# Patient Record
Sex: Male | Born: 1960 | Race: White | Hispanic: No | Marital: Married | Smoking: Never smoker
Health system: Southern US, Community
[De-identification: ages and names within clinical notes are randomized; demographics above are authoritative.]

## PROBLEM LIST (undated history)

## (undated) DIAGNOSIS — T7840XA Allergy, unspecified, initial encounter: Secondary | ICD-10-CM

## (undated) DIAGNOSIS — Z87442 Personal history of urinary calculi: Secondary | ICD-10-CM

## (undated) DIAGNOSIS — L0390 Cellulitis, unspecified: Secondary | ICD-10-CM

## (undated) DIAGNOSIS — K219 Gastro-esophageal reflux disease without esophagitis: Secondary | ICD-10-CM

## (undated) DIAGNOSIS — C4491 Basal cell carcinoma of skin, unspecified: Secondary | ICD-10-CM

## (undated) DIAGNOSIS — Z8619 Personal history of other infectious and parasitic diseases: Secondary | ICD-10-CM

## (undated) HISTORY — DX: Personal history of urinary calculi: Z87.442

## (undated) HISTORY — DX: Allergy, unspecified, initial encounter: T78.40XA

## (undated) HISTORY — DX: Basal cell carcinoma of skin, unspecified: C44.91

## (undated) HISTORY — DX: Gastro-esophageal reflux disease without esophagitis: K21.9

## (undated) HISTORY — DX: Personal history of other infectious and parasitic diseases: Z86.19

---

## 2005-08-15 HISTORY — PX: SHOULDER ARTHROSCOPY W/ ROTATOR CUFF REPAIR: SHX2400

## 2008-10-23 ENCOUNTER — Emergency Department (HOSPITAL_COMMUNITY): Admission: EM | Admit: 2008-10-23 | Discharge: 2008-10-23 | Payer: Self-pay | Admitting: Emergency Medicine

## 2010-08-15 HISTORY — PX: COLONOSCOPY: SHX174

## 2010-11-25 LAB — URINALYSIS, ROUTINE W REFLEX MICROSCOPIC
Bilirubin Urine: NEGATIVE
Glucose, UA: NEGATIVE mg/dL
Specific Gravity, Urine: 1.019 (ref 1.005–1.030)
pH: 7 (ref 5.0–8.0)

## 2010-11-25 LAB — COMPREHENSIVE METABOLIC PANEL
ALT: 17 U/L (ref 0–53)
AST: 25 U/L (ref 0–37)
Albumin: 3.9 g/dL (ref 3.5–5.2)
Chloride: 104 mEq/L (ref 96–112)
Creatinine, Ser: 1.5 mg/dL (ref 0.4–1.5)
GFR calc Af Amer: >60 mL/min (ref >60)
Sodium: 139 mEq/L (ref 135–145)
Total Bilirubin: 0.9 mg/dL (ref 0.3–1.2)

## 2010-11-25 LAB — DIFFERENTIAL
Basophils Absolute: 0 10*3/uL (ref 0.0–0.1)
Eosinophils Relative: 0 % (ref 0–5)
Lymphocytes Relative: 5 % — ABNORMAL LOW (ref 12–46)
Lymphs Abs: 0.5 10*3/uL — ABNORMAL LOW (ref 0.7–4.0)
Monocytes Absolute: 0.4 10*3/uL (ref 0.1–1.0)

## 2010-11-25 LAB — CBC
MCV: 84.4 fL (ref 78.0–100.0)
Platelets: 207 10*3/uL (ref 150–400)
WBC: 10.6 10*3/uL — ABNORMAL HIGH (ref 4.0–10.5)

## 2010-11-25 LAB — URINE MICROSCOPIC-ADD ON

## 2013-12-13 DIAGNOSIS — L0390 Cellulitis, unspecified: Secondary | ICD-10-CM

## 2013-12-13 HISTORY — DX: Cellulitis, unspecified: L03.90

## 2014-01-07 ENCOUNTER — Encounter (HOSPITAL_COMMUNITY): Payer: Self-pay | Admitting: Emergency Medicine

## 2014-01-07 ENCOUNTER — Inpatient Hospital Stay (HOSPITAL_COMMUNITY)
Admission: EM | Admit: 2014-01-07 | Discharge: 2014-01-09 | DRG: 603 | Disposition: A | Discharge status: Home / Self Care | Payer: BC Managed Care – PPO | Attending: Internal Medicine | Admitting: Internal Medicine

## 2014-01-07 DIAGNOSIS — L03114 Cellulitis of left upper limb: Secondary | ICD-10-CM | POA: Diagnosis present

## 2014-01-07 DIAGNOSIS — S51852A Open bite of left forearm, initial encounter: Secondary | ICD-10-CM

## 2014-01-07 DIAGNOSIS — Z79899 Other long term (current) drug therapy: Secondary | ICD-10-CM

## 2014-01-07 DIAGNOSIS — L0390 Cellulitis, unspecified: Secondary | ICD-10-CM | POA: Diagnosis present

## 2014-01-07 DIAGNOSIS — L089 Local infection of the skin and subcutaneous tissue, unspecified: Secondary | ICD-10-CM | POA: Diagnosis present

## 2014-01-07 DIAGNOSIS — S51859A Open bite of unspecified forearm, initial encounter: Secondary | ICD-10-CM

## 2014-01-07 DIAGNOSIS — IMO0001 Reserved for inherently not codable concepts without codable children: Secondary | ICD-10-CM | POA: Diagnosis present

## 2014-01-07 DIAGNOSIS — Z87442 Personal history of urinary calculi: Secondary | ICD-10-CM

## 2014-01-07 DIAGNOSIS — IMO0002 Reserved for concepts with insufficient information to code with codable children: Principal | ICD-10-CM | POA: Diagnosis present

## 2014-01-07 DIAGNOSIS — Z23 Encounter for immunization: Secondary | ICD-10-CM

## 2014-01-07 DIAGNOSIS — W5501XA Bitten by cat, initial encounter: Secondary | ICD-10-CM

## 2014-01-07 DIAGNOSIS — S51809A Unspecified open wound of unspecified forearm, initial encounter: Secondary | ICD-10-CM | POA: Diagnosis present

## 2014-01-07 HISTORY — DX: Cellulitis, unspecified: L03.90

## 2014-01-07 LAB — CBC
HCT: 41.2 % (ref 39.0–52.0)
Hemoglobin: 14.1 g/dL (ref 13.0–17.0)
MCH: 29.9 pg (ref 26.0–34.0)
MCHC: 34.2 g/dL (ref 30.0–36.0)
MCV: 87.3 fL (ref 78.0–100.0)
PLATELETS: 195 10*3/uL (ref 150–400)
RBC: 4.72 MIL/uL (ref 4.22–5.81)
RDW: 13.2 % (ref 11.5–15.5)
WBC: 10 10*3/uL (ref 4.0–10.5)

## 2014-01-07 LAB — BASIC METABOLIC PANEL
BUN: 19 mg/dL (ref 6–23)
CHLORIDE: 101 meq/L (ref 96–112)
CO2: 28 meq/L (ref 19–32)
CREATININE: 1.3 mg/dL (ref 0.50–1.35)
Calcium: 9 mg/dL (ref 8.4–10.5)
GFR calc non Af Amer: 63 mL/min — ABNORMAL LOW (ref >90)
GFR, EST AFRICAN AMERICAN: 73 mL/min — AB (ref >90)
Glucose, Bld: 97 mg/dL (ref 70–99)
POTASSIUM: 4.2 meq/L (ref 3.7–5.3)
SODIUM: 140 meq/L (ref 137–147)

## 2014-01-07 MED ORDER — SODIUM CHLORIDE 0.9 % IJ SOLN
3.0000 mL | Freq: Two times a day (BID) | INTRAMUSCULAR | Status: DC
  Administered 2014-01-08 (×3): 3 mL via INTRAVENOUS

## 2014-01-07 MED ORDER — MORPHINE SULFATE 4 MG/ML IJ SOLN: 4.0000 mg | Freq: Once | INTRAMUSCULAR | Status: DC

## 2014-01-07 MED ORDER — SODIUM CHLORIDE 0.9 % IV SOLN
Freq: Once | INTRAVENOUS | Status: AC
  Administered 2014-01-07: 21:00:00 via INTRAVENOUS

## 2014-01-07 MED ORDER — ONDANSETRON HCL 4 MG/2ML IJ SOLN
4.0000 mg | Freq: Once | INTRAMUSCULAR | Status: DC
Start: 2014-01-07 — End: 2014-01-07

## 2014-01-07 MED ORDER — ALBUTEROL SULFATE (2.5 MG/3ML) 0.083% IN NEBU: 3.0000 mL | INHALATION_SOLUTION | Freq: Four times a day (QID) | RESPIRATORY_TRACT | Status: DC | PRN

## 2014-01-07 MED ORDER — SODIUM CHLORIDE 0.9 % IJ SOLN: 3.0000 mL | INTRAMUSCULAR | Status: DC | PRN

## 2014-01-07 MED ORDER — SODIUM CHLORIDE 0.9 % IV SOLN: INTRAVENOUS | Status: DC

## 2014-01-07 MED ORDER — SODIUM CHLORIDE 0.9 % IV SOLN
3.0000 g | Freq: Once | INTRAVENOUS | Status: AC
  Administered 2014-01-07: 3 g via INTRAVENOUS
  Filled 2014-01-07: qty 3

## 2014-01-07 MED ORDER — AMPICILLIN-SULBACTAM SODIUM 3 (2-1) G IJ SOLR
3.0000 g | Freq: Four times a day (QID) | INTRAMUSCULAR | Status: DC
  Administered 2014-01-08 – 2014-01-09 (×6): 3 g via INTRAVENOUS
  Filled 2014-01-07 (×9): qty 3

## 2014-01-07 MED ORDER — TETANUS-DIPHTH-ACELL PERTUSSIS 5-2.5-18.5 LF-MCG/0.5 IM SUSP
0.5000 mL | Freq: Once | INTRAMUSCULAR | Status: AC
  Administered 2014-01-07: 0.5 mL via INTRAMUSCULAR
  Filled 2014-01-07: qty 0.5

## 2014-01-07 MED ORDER — SODIUM CHLORIDE 0.9 % IV SOLN: 250.0000 mL | INTRAVENOUS | Status: DC | PRN

## 2014-01-07 NOTE — ED Notes (Signed)
Clarified for patient, Dr. Grandville Silos was consulted for hand surgeon.

## 2014-01-07 NOTE — ED Notes (Signed)
Remind patient that he still has pain medication available if needed. No meds requested at this time.

## 2014-01-07 NOTE — Consult Note (Signed)
I spoke with NP Carolanne Grumbling via telephone, seeking advice regarding this patient whom I understand to have had stray cat bites/scratches on the upper extremity, with increasing pain and redness despite beginning on Augmentin.  It was conveyed that the patient had cellulitis, without an abscess, but having progressed on oral antibiotics, it would likely be appropriate for admission for IV antibiotics and observation for response.  I concurred with this assessement, and we agreed that there was no present indication for surgical involvement.  I recommended that the admitting service re-consult surgery if the patient's condition changes such that surgical involvement would be appropriate. (develops a surgically addressable lesion)  Rayvon Char. Grandville Silos, [place] [place], [place]  [address] Office: [phone] Mobile: [phone]

## 2014-01-07 NOTE — ED Notes (Signed)
Patient was bitten by a stray cat that the picked up on the side of the road.  Patient's dermatologist prescribed Augmentin for him but his arm is getting worse.  He has swelling,redness and it is hot to the touch.  He rates his pain 5/10.  The incident happened at 0630hrs and by 1500 he could not move his arm it was so painful.

## 2014-01-07 NOTE — Progress Notes (Signed)
ANTIBIOTIC CONSULT NOTE - INITIAL  Pharmacy Consult for unasyn Indication: animal bite wound infection  No Known Allergies  Patient Measurements: Height: 5' 9.5" (176.5 cm) Weight: 185 lb (83.915 kg) IBW/kg (Calculated) : 71.85   Vital Signs: Temp: 98.2 F (36.8 C) (05/26 1953) Temp src: Oral (05/26 1953) BP: 118/76 mmHg (05/26 2130) Pulse Rate: 64 (05/26 2130) Intake/Output from previous day:   Intake/Output from this shift:    Labs:  Recent Labs  01/07/14 1823 01/07/14 1924  WBC 10.0  --   HGB 14.1  --   PLT 195  --   CREATININE  --  1.30   Estimated Creatinine Clearance: 69.1 ml/min (by C-G formula based on Cr of 1.3). No results found for this basename: VANCOTROUGH, VANCOPEAK, VANCORANDOM, GENTTROUGH, GENTPEAK, GENTRANDOM, TOBRATROUGH, TOBRAPEAK, TOBRARND, AMIKACINPEAK, AMIKACINTROU, AMIKACIN,  in the last 72 hours   Microbiology: No results found for this or any previous visit (from the past 720 hour(s)).  Medical History: History reviewed. No pertinent past medical history.  Medications:  See med rec  Assessment: Patient is a 53 y.o. M who was bitten in the hand by a stray cat.  He was on augmentin PTA (prescription was for 7 days and he had 2 days of it).  He now presents to the ED with c/o increase swelling and redness at the wound site. Patient received unasyn 3gm dose in the ED at 2031.  Plan:  1) unasyn 3gm IV q6h  Kasmira Cacioppo P Cherice Glennie 01/07/2014,10:03 PM

## 2014-01-07 NOTE — H&P (Signed)
Chief Complaint:  Left Arm pain swelling and redness  HPI: 53 yo male healthy was scratched by a cat over the weekend and Sunday started swelling, getting red to left forearm.  Sunday afternoon, his father in law who is a dermatologist (pt does not have a dermatological disease) gave him a script for augmentin which he started Sunday evening.  At that time the left forearm was painful to move, not actually the hand or elbow, he was scratched/bitten in several places but the area that has been most painful is the bite at the mid forearm area.  He has taken the augmentin tid and the swelling and redness has gotten a little worse.  The pain has improved but the erythema has extended from the mid forearm down to the hand and up past the elbow.  He reports his wife is a pediatrician, and earlier today there was some purulent discharge from the site of the mid forearm that he expressed, but none since.  His wife finally convinced him to come to the ED today, as this was not getting any better after 48 hours of augmentin.  He denies any n/v/d.  No fevers/chills.  Again no pain with movement of the wrists, fingers or elbow of the left upper extremity.  Has not been on any other abx besides the augmentin recently.  No health problems.  Was just given a tetanus shot in the ED.   Review of Systems:  Positive and negative as per HPI otherwise all other systems are negative  Past Medical History: History reviewed. No pertinent past medical history. Past Surgical History  Procedure Laterality Date  . Shoulder arthroscopy      Medications: Prior to Admission medications   Medication Sig Start Date End Date Taking? Authorizing Provider  albuterol (PROVENTIL HFA;VENTOLIN HFA) 108 (90 BASE) MCG/ACT inhaler Inhale 2 puffs into the lungs every 6 (six) hours as needed (Allergic asthma).   Yes Historical Provider, MD  amoxicillin-clavulanate (AUGMENTIN) 500-125 MG per tablet Take 1 tablet by mouth 3 (three) times  daily.   Yes Historical Provider, MD  ketotifen (ALAWAY) 0.025 % ophthalmic solution Place 1 drop into both eyes daily as needed.   Yes Historical Provider, MD  loratadine (CLARITIN) 10 MG tablet Take 10 mg by mouth daily as needed for allergies.   Yes Historical Provider, MD  mometasone (NASONEX) 50 MCG/ACT nasal spray Place 2 sprays into the nose daily as needed.   Yes Historical Provider, MD    Allergies:  No Known Allergies  Social History:  reports that he has never smoked. He has never used smokeless tobacco. He reports that he does not drink alcohol or use illicit drugs.  Family History: History reviewed. No pertinent family history.  Physical Exam: Filed Vitals:   01/07/14 1803 01/07/14 1953 01/07/14 2000 01/07/14 2109  BP: 114/75 112/64 116/80 115/77  Pulse:  70 70 81  Temp: 98.1 F (36.7 C) 98.2 F (36.8 C)    TempSrc: Oral Oral    Resp: 18 20    Height: 5' 9.5" (1.765 m)     Weight: 83.915 kg (185 lb)     SpO2: 97% 99% 96% 100%   General appearance: alert, cooperative and no distress Head: Normocephalic, without obvious abnormality, atraumatic Eyes: negative Nose: Nares normal. Septum midline. Mucosa normal. No drainage or sinus tenderness. Neck: no JVD and supple, symmetrical, trachea midline Lungs: clear to auscultation bilaterally Heart: regular rate and rhythm, S1, S2 normal, no murmur, click, rub or gallop  Abdomen: soft, non-tender; bowel sounds normal; no masses,  no organomegaly Extremities: extremities normal, atraumatic, no cyanosis or edema left forearm mid has bite mark with surrounding errythema down to wrist and up past elbow, no induration or flunctuance anywhere in extremity.  No pain with movement of any joints.  No drainage.  C/w with infected animal bite cellulitis with no absess formation evident anywhere. Pulses: 2+ and symmetric Skin: Skin color, texture, turgor normal. No rashes or lesions  X above of lue Neurologic: Grossly normal  Labs on  Admission:   Recent Labs  01/07/14 1924  NA 140  K 4.2  CL 101  CO2 28  GLUCOSE 97  BUN 19  CREATININE 1.30  CALCIUM 9.0    Recent Labs  01/07/14 1823  WBC 10.0  HGB 14.1  HCT 41.2  MCV 87.3  PLT 195   Radiological Exams on Admission: No results found.  Assessment/Plan  53 yo healthy male with cat bite infected to lue failed outpt treatment with augmentin  Principal Problem:   Cellulitis of arm, left-  Place on iv unasyn.  Should improve over the next 24 to 48 hours.  Admit to med surg bed.  Full code.  Tetanus updated.  Active Problems:   Cat bite of left forearm with infection    Monicka Cyran A Trenise Turay 01/07/2014, 9:14 PM

## 2014-01-07 NOTE — ED Notes (Signed)
Baker Janus, NP at the bedside.

## 2014-01-07 NOTE — ED Notes (Signed)
Called bed control.

## 2014-01-07 NOTE — ED Notes (Signed)
Attempted to call report

## 2014-01-07 NOTE — ED Notes (Signed)
Patient is unsure of last tetanus shot.

## 2014-01-07 NOTE — ED Notes (Signed)
Dr. Shanon Brow (hospitalist) at the bedside.

## 2014-01-07 NOTE — ED Notes (Signed)
Attempted to call report. Receiving nurse will call back.  

## 2014-01-07 NOTE — ED Provider Notes (Signed)
CSN: [id number]     Arrival date & time 01/07/14  1721 History   First MD Initiated Contact with Patient 01/07/14 1955     Chief Complaint  Patient presents with  . Animal Bite    Patient was bitten by a stray cat that the picked up on the side of the road.  Patient's dermatologist prescribed Augmentin for him but his arm is getting worse.  He has swelling,redness and it is hot to the touch.  He rates his pain 5/10.     (Consider location/radiation/quality/duration/timing/severity/associated sxs/prior Treatment) HPI Comments: The patient.  Catheter that unprovoked bit him 3 times on the left forearm, left upper arm, and scratched him on the palm of his right hand.  He has been on Augmentin and 6, but the swelling is increasing.  Patient is a 53 y.o. male presenting with animal bite. The history is provided by the patient.  Animal Bite Contact animal:  Cat Location:  Shoulder/arm Shoulder/arm injury location:  L forearm Time since incident:  2 days Pain details:    Quality:  Aching   Severity:  Mild   Timing:  Constant   Progression:  Worsening Incident location:  Outside Animal's rabies vaccination status:  Unknown Animal in possession: no   Tetanus status:  Out of date Relieved by:  Prescription drugs Worsened by:  Activity Ineffective treatments:  Prescription drugs Associated symptoms: swelling   Associated symptoms: no numbness     History reviewed. No pertinent past medical history. Past Surgical History  Procedure Laterality Date  . Shoulder arthroscopy     History reviewed. No pertinent family history. History  Substance Use Topics  . Smoking status: Never Smoker   . Smokeless tobacco: Never Used  . Alcohol Use: No    Review of Systems  Constitutional: Negative for activity change.  Gastrointestinal: Negative for nausea.  Musculoskeletal: Negative for myalgias.  Neurological: Negative for numbness.  All other systems reviewed and are  negative.     Allergies  Review of patient's allergies indicates no known allergies.  Home Medications   Prior to Admission medications   Medication Sig Start Date End Date Taking? Authorizing Provider  albuterol (PROVENTIL HFA;VENTOLIN HFA) 108 (90 BASE) MCG/ACT inhaler Inhale 2 puffs into the lungs every 6 (six) hours as needed (Allergic asthma).   Yes Historical Provider, MD  amoxicillin-clavulanate (AUGMENTIN) 500-125 MG per tablet Take 1 tablet by mouth 3 (three) times daily.   Yes Historical Provider, MD  ketotifen (ALAWAY) 0.025 % ophthalmic solution Place 1 drop into both eyes daily as needed.   Yes Historical Provider, MD  loratadine (CLARITIN) 10 MG tablet Take 10 mg by mouth daily as needed for allergies.   Yes Historical Provider, MD  mometasone (NASONEX) 50 MCG/ACT nasal spray Place 2 sprays into the nose daily as needed.   Yes Historical Provider, MD   BP 115/77  Pulse 81  Temp(Src) 98.2 F (36.8 C) (Oral)  Resp 20  Ht 5' 9.5" (1.765 m)  Wt 185 lb (83.915 kg)  BMI 26.94 kg/m2  SpO2 100% Physical Exam  Nursing note and vitals reviewed. Constitutional: He appears well-developed and well-nourished.  Eyes: Pupils are equal, round, and reactive to light.  Neck: Normal range of motion.  Cardiovascular: Normal rate.   Musculoskeletal: He exhibits edema and tenderness.       Arms: Entire left arm swollen, moderately tender  Neurological: He is alert.  Skin: There is erythema.    ED Course  Procedures (including critical care  time) Labs Review Labs Reviewed  BASIC METABOLIC PANEL - Abnormal; Notable for the following:    GFR calc non Af Amer 63 (*)    GFR calc Af Amer 73 (*)    All other components within normal limits  CBC    Imaging Review No results found.   EKG Interpretation None      MDM  I spoke with Dr. Grandville Silos, hand surgeon, who recommended admission to the hospital for IV antibiotics.  If the patient's condition.  Gets worse or he needs  surgery.  He would be glad to see the patient.  In the hospital Final diagnoses:  Cat bite of forearm         Garald Balding, NP 01/07/14 2119

## 2014-01-07 NOTE — ED Notes (Signed)
Inspected left arm, no new redness outside of original outline.

## 2014-01-08 ENCOUNTER — Inpatient Hospital Stay (HOSPITAL_COMMUNITY): Payer: BC Managed Care – PPO

## 2014-01-08 ENCOUNTER — Encounter (HOSPITAL_COMMUNITY): Payer: Self-pay | Admitting: General Practice

## 2014-01-08 DIAGNOSIS — S51809A Unspecified open wound of unspecified forearm, initial encounter: Secondary | ICD-10-CM

## 2014-01-08 LAB — BASIC METABOLIC PANEL
BUN: 15 mg/dL (ref 6–23)
CALCIUM: 8.3 mg/dL — AB (ref 8.4–10.5)
CO2: 26 meq/L (ref 19–32)
Chloride: 107 mEq/L (ref 96–112)
Creatinine, Ser: 1.11 mg/dL (ref 0.50–1.35)
GFR calc Af Amer: 88 mL/min — ABNORMAL LOW (ref >90)
GFR calc non Af Amer: 76 mL/min — ABNORMAL LOW (ref >90)
GLUCOSE: 109 mg/dL — AB (ref 70–99)
Potassium: 4.2 mEq/L (ref 3.7–5.3)
SODIUM: 142 meq/L (ref 137–147)

## 2014-01-08 LAB — CBC
HEMATOCRIT: 39.1 % (ref 39.0–52.0)
HEMOGLOBIN: 13.2 g/dL (ref 13.0–17.0)
MCH: 29.3 pg (ref 26.0–34.0)
MCHC: 33.8 g/dL (ref 30.0–36.0)
MCV: 86.9 fL (ref 78.0–100.0)
Platelets: 178 10*3/uL (ref 150–400)
RBC: 4.5 MIL/uL (ref 4.22–5.81)
RDW: 13.3 % (ref 11.5–15.5)
WBC: 6.3 10*3/uL (ref 4.0–10.5)

## 2014-01-08 MED ORDER — BACITRACIN-NEOMYCIN-POLYMYXIN OINTMENT TUBE
TOPICAL_OINTMENT | Freq: Two times a day (BID) | CUTANEOUS | Status: DC
  Administered 2014-01-08 (×2): via TOPICAL
  Filled 2014-01-08: qty 15

## 2014-01-08 MED ORDER — HEPARIN SODIUM (PORCINE) 5000 UNIT/ML IJ SOLN
5000.0000 [IU] | Freq: Three times a day (TID) | INTRAMUSCULAR | Status: DC
  Filled 2014-01-08 (×7): qty 1

## 2014-01-08 MED ORDER — DOUBLE ANTIBIOTIC 500-10000 UNIT/GM EX OINT
TOPICAL_OINTMENT | Freq: Two times a day (BID) | CUTANEOUS | Status: DC
  Filled 2014-01-08 (×16): qty 1

## 2014-01-08 NOTE — Progress Notes (Signed)
Pt arrived to floor via stretcher. Pt was oriented to room & call bell system. VS have been taken. Pt in no apparent distress at this time.  

## 2014-01-08 NOTE — Progress Notes (Signed)
Patient Demographics  Barry Wilcox, is a 53 y.o. male, DOB - [birth year], VZC:[id number]  Admit date - 01/07/2014   Admitting Physician Phillips Grout, MD  Outpatient Primary MD for the patient is Pcp Not In System  LOS - 1   Chief Complaint  Patient presents with  . Animal Bite    Patient was bitten by a stray cat that the picked up on the side of the road.  Patient's dermatologist prescribed Augmentin for him but his arm is getting worse.  He has swelling,redness and it is hot to the touch.  He rates his pain 5/10.           Subjective:   Barry Wilcox today has, No headache, No chest pain, No abdominal pain - No Nausea, No new weakness tingling or numbness, No Cough - SOB. Minimal L arm pain.    Assessment & Plan    1. Cat bite induced left arm cellulitis. Site appears stable and has responded well to IV Unasyn, will monitor soft tissue ultrasound to rule out any pus pocket. Exam is stable, cellulitis much improved, plan is to transition to oral Augmentin tomorrow and discharge home if continues to improve. Has received tetanus shot in the ER.   Code Status:  Full  Family Communication: Friend bedside  Disposition Plan: Home   Procedures L Arm [place]   Consults  None   Medications  Scheduled Meds: . ampicillin-sulbactam (UNASYN) IV  3 g Intravenous Q6H  . heparin subcutaneous  5,000 Units Subcutaneous 3 times per day  . neomycin-bacitracin-polymyxin   Topical BID  . sodium chloride  3 mL Intravenous Q12H   Continuous Infusions:  PRN Meds:.sodium chloride, albuterol, sodium chloride  DVT Prophylaxis   Heparin   Lab Results  Component Value Date   PLT 178 01/08/2014    Antibiotics     Anti-infectives   Start     Dose/Rate Route Frequency Ordered Stop   01/08/14 0200  Ampicillin-Sulbactam (UNASYN) 3 g in sodium chloride 0.9 % 100 mL IVPB     3 g 100 mL/hr over 60 Minutes Intravenous Every 6 hours 01/07/14 2224     01/07/14 2000  Ampicillin-Sulbactam (UNASYN) 3 g in sodium chloride 0.9 % 100 mL IVPB     3 g 100 mL/hr over 60 Minutes Intravenous  Once 01/07/14 1956 01/07/14 2135          Objective:   Filed Vitals:   01/07/14 2130 01/07/14 2220 01/07/14 2345 01/08/14 0700  BP: 118/76 130/84 113/76 101/63  Pulse: 64 73 60 61  Temp:   97.8 F (36.6 C) 97.8 F (36.6 C)  TempSrc:   Oral Oral  Resp:  18 20 18   Height:   5' 9.5" (1.765 m)   Weight:   85.957 kg (189 lb 8 oz)   SpO2: 98% 99% 98% 98%    Wt Readings from Last 3 Encounters:  01/07/14 85.957 kg (189 lb 8 oz)     Intake/Output Summary (Last 24 hours) at 01/08/14 1116 Last data filed at 01/08/14 0916  Gross per 24 hour  Intake    123 ml  Output      0 ml  Net    123 ml  Physical Exam  Awake Alert, Oriented X 3, No new F.N deficits, Normal affect [place].AT,PERRAL Supple Neck,No JVD, No cervical lymphadenopathy appriciated.  Symmetrical Chest wall movement, Good air movement bilaterally, CTAB RRR,No Gallops,Rubs or new Murmurs, No Parasternal Heave +ve B.Sounds, Abd Soft, No tenderness, No organomegaly appriciated, No rebound - guarding or rigidity. No Cyanosis, Clubbing or edema, No new Rash or bruise  R. Palm small superficial scratch, L Arm cat bite site looks stable, minimal cellulitis no fluctuance   Data Review   Micro Results No results found for this or any previous visit (from the past 240 hour(s)).  Radiology Reports No results found.  CBC  Recent Labs Lab 01/07/14 1823 01/08/14 0843  WBC 10.0 6.3  HGB 14.1 13.2  HCT 41.2 39.1  PLT 195 178  MCV 87.3 86.9  MCH 29.9 29.3  MCHC 34.2 33.8  RDW 13.2 13.3    Chemistries   Recent Labs Lab 01/07/14 1924 01/08/14 0843  NA 140 142  K 4.2 4.2  CL 101 107  CO2 28 26  GLUCOSE 97 109*    BUN 19 15  CREATININE 1.30 1.11  CALCIUM 9.0 8.3*   ------------------------------------------------------------------------------------------------------------------ estimated creatinine clearance is 81 ml/min (by C-G formula based on Cr of 1.11). ------------------------------------------------------------------------------------------------------------------ No results found for this basename: HGBA1C,  in the last 72 hours ------------------------------------------------------------------------------------------------------------------ No results found for this basename: CHOL, HDL, LDLCALC, TRIG, CHOLHDL, LDLDIRECT,  in the last 72 hours ------------------------------------------------------------------------------------------------------------------ No results found for this basename: TSH, T4TOTAL, FREET3, T3FREE, THYROIDAB,  in the last 72 hours ------------------------------------------------------------------------------------------------------------------ No results found for this basename: VITAMINB12, FOLATE, FERRITIN, TIBC, IRON, RETICCTPCT,  in the last 72 hours  Coagulation profile No results found for this basename: INR, PROTIME,  in the last 168 hours  No results found for this basename: DDIMER,  in the last 72 hours  Cardiac Enzymes No results found for this basename: CK, CKMB, TROPONINI, MYOGLOBIN,  in the last 168 hours ------------------------------------------------------------------------------------------------------------------ No components found with this basename: POCBNP,      Time Spent in minutes   35   Thurnell Lose M.D on 01/08/2014 at 11:16 AM  Between 7am to 7pm - Pager - [phone]  After 7pm go to [email or web address] - password [email or web address]  And look for the night coverage person covering for me after hours  [facility] Office  [phone]   **Disclaimer: This note may have been dictated with voice recognition software. Similar  sounding words can inadvertently be transcribed and this note may contain transcription errors which may not have been corrected upon publication of note.**

## 2014-01-08 NOTE — Progress Notes (Signed)
Utilization review completed.  

## 2014-01-09 MED ORDER — BACITRACIN-NEOMYCIN-POLYMYXIN OINTMENT TUBE: 1.0000 "application " | TOPICAL_OINTMENT | Freq: Two times a day (BID) | CUTANEOUS | Status: DC

## 2014-01-09 MED ORDER — AMOXICILLIN-POT CLAVULANATE 875-125 MG PO TABS: 1.0000 | ORAL_TABLET | Freq: Two times a day (BID) | ORAL | Status: DC

## 2014-01-09 NOTE — Discharge Instructions (Signed)
Follow with Primary MD  in 7 days, Get CBC, CMP, checked .  Keep your skin wounds clean and dry.    Activity: As tolerated with Full fall precautions use walker/cane & assistance as needed   Disposition Home    Diet: Heart Healthy    For Heart failure patients - Check your Weight same time everyday, if you gain over 2 pounds, or you develop in leg swelling, experience more shortness of breath or chest pain, call your Primary MD immediately. Follow Cardiac Low Salt Diet and 1.8 lit/day fluid restriction.   On your next visit with her primary care physician please Get Medicines reviewed and adjusted.  Please request your Prim.MD to go over all Hospital Tests and Procedure/Radiological results at the follow up, please get all Hospital records sent to your Prim MD by signing hospital release before you go home.   If you experience worsening of your admission symptoms, develop shortness of breath, life threatening emergency, suicidal or homicidal thoughts you must seek medical attention immediately by calling 911 or calling your MD immediately  if symptoms less severe.  You Must read complete instructions/literature along with all the possible adverse reactions/side effects for all the Medicines you take and that have been prescribed to you. Take any new Medicines after you have completely understood and accpet all the possible adverse reactions/side effects.   Do not drive and provide baby sitting services if your were admitted for syncope or siezures until you have seen by Primary MD or a Neurologist and advised to do so again.  Do not drive when taking Pain medications.    Do not take more than prescribed Pain, Sleep and Anxiety Medications  Special Instructions: If you have smoked or chewed Tobacco  in the last 2 yrs please stop smoking, stop any regular Alcohol  and or any Recreational drug use.  Wear Seat belts while driving.   Please note  You were cared for by a hospitalist  during your hospital stay. If you have any questions about your discharge medications or the care you received while you were in the hospital after you are discharged, you can call the unit and asked to speak with the hospitalist on call if the hospitalist that took care of you is not available. Once you are discharged, your primary care physician will handle any further medical issues. Please note that NO REFILLS for any discharge medications will be authorized once you are discharged, as it is imperative that you return to your primary care physician (or establish a relationship with a primary care physician if you do not have one) for your aftercare needs so that they can reassess your need for medications and monitor your lab values.

## 2014-01-09 NOTE — Care Management Note (Signed)
    Page 1 of 1   01/09/2014     2:16:37 PM CARE MANAGEMENT NOTE 01/09/2014  Patient:  Barry Wilcox, Barry Wilcox   Account Number:  [id number]  Date Initiated:  01/09/2014  Documentation initiated by:  Tomi Bamberger  Subjective/Objective Assessment:   dx cate bite, cellulitis  admit- lives with spouse. pta indep.     Action/Plan:   Anticipated DC Date:  01/09/2014   Anticipated DC Plan:  HOME/SELF CARE         Choice offered to / List presented to:             Status of service:  Completed, signed off Medicare Important Message given?   (If response is "NO", the following Medicare IM given date fields will be blank) Date Medicare IM given:   Date Additional Medicare IM given:    Discharge Disposition:  HOME/SELF CARE  Per UR Regulation:  Reviewed for med. necessity/level of care/duration of stay  If discussed at [place] of Stay Meetings, dates discussed:    Comments:

## 2014-01-09 NOTE — Discharge Summary (Signed)
Barry Wilcox, is a 53 y.o. male  DOB Nov 06, 1960  MRN [id number].  Admission date:  01/07/2014  Admitting Physician  Phillips Grout, MD  Discharge Date:  01/09/2014   Primary MD  Pcp Not In System  Recommendations for primary care physician for things to follow:   Monitor left arm wound closely   Admission Diagnosis  Cellulitis of arm, left [682.3] Cat bite of forearm [881.00, E906.3] Cat bite of left forearm with infection [881.10, E906.3]   Discharge Diagnosis  Cellulitis of arm, left [682.3] Cat bite of forearm [881.00, E906.3] Cat bite of left forearm with infection [881.10, E906.3]    Principal Problem:   Cellulitis of arm, left Active Problems:   Cat bite of left forearm with infection   Cellulitis      Past Medical History  Diagnosis Date  . Kidney stones     "no OR"  . Cellulitis 12/2013    LUE "cat bite"    Past Surgical History  Procedure Laterality Date  . Shoulder arthroscopy w/ rotator cuff repair Left ~ 2007     Discharge Condition: Stable   Follow UP  Follow-up Information   Follow up with PCP. Schedule an appointment as soon as possible for a visit in 1 week.        Discharge Instructions  and  Discharge Medications      Discharge Instructions   Discharge instructions    Complete by:  As directed   Follow with Primary MD  in 7 days, Get CBC, CMP, checked .  Keep your skin wounds clean and dry.    Activity: As tolerated with Full fall precautions use walker/cane & assistance as needed   Disposition Home    Diet: Heart Healthy    For Heart failure patients - Check your Weight same time everyday, if you gain over 2 pounds, or you develop in leg swelling, experience more shortness of breath or chest pain, call your Primary MD immediately. Follow Cardiac Low Salt Diet and  1.8 lit/day fluid restriction.   On your next visit with her primary care physician please Get Medicines reviewed and adjusted.  Please request your Prim.MD to go over all Hospital Tests and Procedure/Radiological results at the follow up, please get all Hospital records sent to your Prim MD by signing hospital release before you go home.   If you experience worsening of your admission symptoms, develop shortness of breath, life threatening emergency, suicidal or homicidal thoughts you must seek medical attention immediately by calling 911 or calling your MD immediately  if symptoms less severe.  You Must read complete instructions/literature along with all the possible adverse reactions/side effects for all the Medicines you take and that have been prescribed to you. Take any new Medicines after you have completely understood and accpet all the possible adverse reactions/side effects.   Do not drive and provide baby sitting services if your were admitted for syncope or siezures until you have seen by Primary MD or a Neurologist and advised to  do so again.  Do not drive when taking Pain medications.    Do not take more than prescribed Pain, Sleep and Anxiety Medications  Special Instructions: If you have smoked or chewed Tobacco  in the last 2 yrs please stop smoking, stop any regular Alcohol  and or any Recreational drug use.  Wear Seat belts while driving.   Please note  You were cared for by a hospitalist during your hospital stay. If you have any questions about your discharge medications or the care you received while you were in the hospital after you are discharged, you can call the unit and asked to speak with the hospitalist on call if the hospitalist that took care of you is not available. Once you are discharged, your primary care physician will handle any further medical issues. Please note that NO REFILLS for any discharge medications will be authorized once you are discharged,  as it is imperative that you return to your primary care physician (or establish a relationship with a primary care physician if you do not have one) for your aftercare needs so that they can reassess your need for medications and monitor your lab values.  Follow with Primary MD  in 7 days, Get CBC, CMP, checked .  Keep your skin wounds clean and dry.    Activity: As tolerated with Full fall precautions use walker/cane & assistance as needed   Disposition Home    Diet: Heart Healthy    For Heart failure patients - Check your Weight same time everyday, if you gain over 2 pounds, or you develop in leg swelling, experience more shortness of breath or chest pain, call your Primary MD immediately. Follow Cardiac Low Salt Diet and 1.8 lit/day fluid restriction.   On your next visit with her primary care physician please Get Medicines reviewed and adjusted.  Please request your Prim.MD to go over all Hospital Tests and Procedure/Radiological results at the follow up, please get all Hospital records sent to your Prim MD by signing hospital release before you go home.   If you experience worsening of your admission symptoms, develop shortness of breath, life threatening emergency, suicidal or homicidal thoughts you must seek medical attention immediately by calling 911 or calling your MD immediately  if symptoms less severe.  You Must read complete instructions/literature along with all the possible adverse reactions/side effects for all the Medicines you take and that have been prescribed to you. Take any new Medicines after you have completely understood and accpet all the possible adverse reactions/side effects.   Do not drive and provide baby sitting services if your were admitted for syncope or siezures until you have seen by Primary MD or a Neurologist and advised to do so again.  Do not drive when taking Pain medications.    Do not take more than prescribed Pain, Sleep and Anxiety  Medications  Special Instructions: If you have smoked or chewed Tobacco  in the last 2 yrs please stop smoking, stop any regular Alcohol  and or any Recreational drug use.  Wear Seat belts while driving.   Please note  You were cared for by a hospitalist during your hospital stay. If you have any questions about your discharge medications or the care you received while you were in the hospital after you are discharged, you can call the unit and asked to speak with the hospitalist on call if the hospitalist that took care of you is not available. Once you are discharged, your primary  care physician will handle any further medical issues. Please note that NO REFILLS for any discharge medications will be authorized once you are discharged, as it is imperative that you return to your primary care physician (or establish a relationship with a primary care physician if you do not have one) for your aftercare needs so that they can reassess your need for medications and monitor your lab values.     Increase activity slowly    Complete by:  As directed             Medication List    STOP taking these medications       amoxicillin-clavulanate 500-125 MG per tablet  Commonly known as:  AUGMENTIN  Replaced by:  amoxicillin-clavulanate 875-125 MG per tablet      TAKE these medications       ALAWAY 0.025 % ophthalmic solution  Generic drug:  ketotifen  Place 1 drop into both eyes daily as needed.     albuterol 108 (90 BASE) MCG/ACT inhaler  Commonly known as:  PROVENTIL HFA;VENTOLIN HFA  Inhale 2 puffs into the lungs every 6 (six) hours as needed (Allergic asthma).     amoxicillin-clavulanate 875-125 MG per tablet  Commonly known as:  AUGMENTIN  Take 1 tablet by mouth 2 (two) times daily.     loratadine 10 MG tablet  Commonly known as:  CLARITIN  Take 10 mg by mouth daily as needed for allergies.     mometasone 50 MCG/ACT nasal spray  Commonly known as:  NASONEX  Place 2 sprays into  the nose daily as needed.     neomycin-bacitracin-polymyxin Oint  Commonly known as:  NEOSPORIN  Apply 1 application topically 2 (two) times daily.          Diet and Activity recommendation: See Discharge Instructions above   Consults obtained -     Major procedures and Radiology Reports - PLEASE review detailed and final reports for all details, in brief -       [place] Misc Soft Tissue  01/08/2014   CLINICAL DATA:  Left arm cellulitis.  Cat bite.  EXAM: SOFT TISSUE ULTRASOUND OF THE LEFT FOREARM.  TECHNIQUE: Realtime ultrasound was performed over the area of concern in the left forearm.  COMPARISON:  None.  FINDINGS: There is no subcutaneous edema, mass, or fluid collection or abscess.  IMPRESSION: Normal exam.   Electronically Signed   By: Rozetta Nunnery M.D.   On: 01/08/2014 21:35    Micro Results      No results found for this or any previous visit (from the past 240 hour(s)).   History of present illness and  Hospital Course:     Kindly see H&P for history of present illness and admission details, please review complete Labs, Consult reports and Test reports for all details in brief ARTH NICASTRO, is a 53 y.o. male, patient  previously healthy, presented to the hospital with worsening left arm cat bite site cellulitis, he was on a moderate dose Augmentin but did not improve, he was placed on IV Unasyn with excellent results, cellulitis almost completely resolved within 24 hours, soft tissue ultrasound of that site stable with no signs of abscess. He is afebrile, his left forearm cat bite site appears almost back to normal. No fluctuance on exam. Will place him on a higher dose Augmentin and discharge with close outpatient followup with PCP. He did receive tetanus shot in the ER.  Today   Subjective:   Barry Wilcox today has no headache,no chest abdominal pain,no new weakness tingling or numbness, feels much better wants to go home today.     Objective:   Blood pressure 121/76, pulse 61, temperature 97.7 F (36.5 C), temperature source Oral, resp. rate 17, height 5' 9.5" (1.765 m), weight 85.957 kg (189 lb 8 oz), SpO2 98.00%.   Intake/Output Summary (Last 24 hours) at 01/09/14 0850 Last data filed at 01/08/14 1730  Gross per 24 hour  Intake    447 ml  Output      0 ml  Net    447 ml    Exam Awake Alert, Oriented x 3, No new F.N deficits, Normal affect Waves.AT,PERRAL Supple Neck,No JVD, No cervical lymphadenopathy appriciated.  Symmetrical Chest wall movement, Good air movement bilaterally, CTAB RRR,No Gallops,Rubs or new Murmurs, No Parasternal Heave +ve B.Sounds, Abd Soft, Non tender, No organomegaly appriciated, No rebound -guarding or rigidity. No Cyanosis, Clubbing or edema, No new Rash or bruise, left forearm cat bite site stable, almost no cellulitis, no fluctuance, right palm abrasion stable.  Data Review   CBC w Diff: Lab Results  Component Value Date   WBC 6.3 01/08/2014   HGB 13.2 01/08/2014   HCT 39.1 01/08/2014   PLT 178 01/08/2014   LYMPHOPCT 5* 10/23/2008   MONOPCT 4 10/23/2008   EOSPCT 0 10/23/2008   BASOPCT 0 10/23/2008    CMP: Lab Results  Component Value Date   NA 142 01/08/2014   K 4.2 01/08/2014   CL 107 01/08/2014   CO2 26 01/08/2014   BUN 15 01/08/2014   CREATININE 1.11 01/08/2014   PROT 6.7 10/23/2008   ALBUMIN 3.9 10/23/2008   BILITOT 0.9 10/23/2008   ALKPHOS 71 10/23/2008   AST 25 10/23/2008   ALT 17 10/23/2008  .   Total Time in preparing paper work, data evaluation and todays exam - 35 minutes  Thurnell Lose M.D on 01/09/2014 at 8:50 AM  [facility] Office  [phone]   **Disclaimer: This note may have been dictated with voice recognition software. Similar sounding words can inadvertently be transcribed and this note may contain transcription errors which may not have been corrected upon publication of note.**

## 2014-01-10 NOTE — ED Provider Notes (Signed)
Medical screening examination/treatment/procedure(s) were performed by non-physician practitioner and as supervising physician I was immediately available for consultation/collaboration.   EKG Interpretation None        Ephraim Hamburger, MD 01/10/14 1601

## 2016-03-16 ENCOUNTER — Encounter (HOSPITAL_COMMUNITY): Payer: Self-pay | Admitting: Emergency Medicine

## 2016-03-16 ENCOUNTER — Emergency Department (HOSPITAL_COMMUNITY)
Admission: EM | Admit: 2016-03-16 | Discharge: 2016-03-16 | Disposition: A | Discharge status: Home / Self Care | Payer: BC Managed Care – PPO | Attending: Emergency Medicine | Admitting: Emergency Medicine

## 2016-03-16 DIAGNOSIS — Z23 Encounter for immunization: Secondary | ICD-10-CM

## 2016-03-16 MED ORDER — RABIES IMMUNE GLOBULIN 150 UNIT/ML IM INJ
1800.0000 [IU] | INJECTION | Freq: Once | INTRAMUSCULAR | Status: AC
  Administered 2016-03-16: 1800 [IU] via INTRAMUSCULAR
  Filled 2016-03-16: qty 12

## 2016-03-16 MED ORDER — RABIES VACCINE, PCEC IM SUSR
1.0000 mL | Freq: Once | INTRAMUSCULAR | Status: AC
  Administered 2016-03-16: 1 mL via INTRAMUSCULAR
  Filled 2016-03-16: qty 1

## 2016-03-16 NOTE — ED Provider Notes (Signed)
[facility] DEPT Provider Note   CSN: [id number] Arrival date & time: 03/16/16  1552  First Provider Contact:  First MD Initiated Contact with Patient 03/16/16 1616        History   Chief Complaint Chief Complaint  Patient presents with  . Rabies Injection    HPI Barry Wilcox is a 55 y.o. male.  Patient presents as a member of a family of 30. Approximately one month ago the family found and injured young raccoon area did they kept it in care. For several days bottlefeeding cleaning up after including feces. They held it. No frank bites or wounds. They take it every 8 hours are preserved where other records were. They were contacted today that the raccoon died unexpectedly and tested positive for rabies.  None of the family members have symptoms. They were appropriately referred for rabies postexposure vaccination and immunoglobulin.  HPI  Past Medical History:  Diagnosis Date  . Cellulitis 12/2013   LUE "cat bite"  . Kidney stones    "no OR"    Patient Active Problem List   Diagnosis Date Noted  . Cellulitis of arm, left 01/07/2014  . Cat bite of left forearm with infection 01/07/2014  . Cellulitis 01/07/2014    Past Surgical History:  Procedure Laterality Date  . SHOULDER ARTHROSCOPY W/ ROTATOR CUFF REPAIR Left ~ 2007       Home Medications    Prior to Admission medications   Medication Sig Start Date End Date Taking? Authorizing Provider  albuterol (PROVENTIL HFA;VENTOLIN HFA) 108 (90 BASE) MCG/ACT inhaler Inhale 2 puffs into the lungs every 6 (six) hours as needed (Allergic asthma).    Historical Provider, MD  amoxicillin-clavulanate (AUGMENTIN) 875-125 MG per tablet Take 1 tablet by mouth 2 (two) times daily. 01/09/14   Thurnell Lose, MD  ketotifen (ALAWAY) 0.025 % ophthalmic solution Place 1 drop into both eyes daily as needed.    Historical Provider, MD  loratadine (CLARITIN) 10 MG tablet Take 10 mg by mouth daily as needed for allergies.     Historical Provider, MD  mometasone (NASONEX) 50 MCG/ACT nasal spray Place 2 sprays into the nose daily as needed.    Historical Provider, MD  neomycin-bacitracin-polymyxin (NEOSPORIN) OINT Apply 1 application topically 2 (two) times daily. 01/09/14   Thurnell Lose, MD    Family History History reviewed. No pertinent family history.  Social History Social History  Substance Use Topics  . Smoking status: Never Smoker  . Smokeless tobacco: Never Used  . Alcohol use Yes     Comment: 01/08/2014 "glass of wine maybe 4 times/yr"     Allergies   Review of patient's allergies indicates no known allergies.   Review of Systems Review of Systems  Constitutional: Negative for appetite change, chills, diaphoresis, fatigue and fever.  HENT: Negative for mouth sores, sore throat and trouble swallowing.   Eyes: Negative for visual disturbance.  Respiratory: Negative for cough, chest tightness, shortness of breath and wheezing.   Cardiovascular: Negative for chest pain.  Gastrointestinal: Negative for abdominal distention, abdominal pain, diarrhea, nausea and vomiting.  Endocrine: Negative for polydipsia, polyphagia and polyuria.  Genitourinary: Negative for dysuria, frequency and hematuria.  Musculoskeletal: Negative for gait problem.  Skin: Negative for color change, pallor and rash.  Neurological: Negative for dizziness, syncope, light-headedness and headaches.  Hematological: Does not bruise/bleed easily.  Psychiatric/Behavioral: Negative for behavioral problems and confusion.     Physical Exam Updated Vital Signs BP 128/86 (BP Location: Left Arm)  Pulse 68   Temp 98.3 F (36.8 C) (Oral)   Resp 16   Ht 5\' 10"  (1.778 m)   Wt 201 lb (91.2 kg)   SpO2 98%   BMI 28.84 kg/m   Physical Exam  Constitutional: He is oriented to person, place, and time. He appears well-developed and well-nourished. No distress.  HENT:  Head: Normocephalic.  Eyes: Conjunctivae are normal. Pupils are  equal, round, and reactive to light. No scleral icterus.  Neck: Normal range of motion. Neck supple. No thyromegaly present.  Cardiovascular: Normal rate and regular rhythm.  Exam reveals no gallop and no friction rub.   No murmur heard. Pulmonary/Chest: Effort normal and breath sounds normal. No respiratory distress. He has no wheezes. He has no rales.  Abdominal: Soft. Bowel sounds are normal. He exhibits no distension. There is no tenderness. There is no rebound.  Musculoskeletal: Normal range of motion.  Neurological: He is alert and oriented to person, place, and time.  Skin: Skin is warm and dry. No rash noted.  Psychiatric: He has a normal mood and affect. His behavior is normal.     ED Treatments / Results  Labs (all labs ordered are listed, but only abnormal results are displayed) Labs Reviewed - No data to display  EKG  EKG Interpretation None       Radiology No results found.  Procedures Procedures (including critical care time)  Medications Ordered in ED Medications  rabies immune globulin (HYPERAB) injection 1,800 Units (not administered)  rabies vaccine (RABAVERT) injection 1 mL (1 mL Intramuscular Given 03/16/16 1726)     Initial Impression / Assessment and Plan / ED Course  I have reviewed the triage vital signs and the nursing notes.  Pertinent labs & imaging results that were available during my care of the patient were reviewed by me and considered in my medical decision making (see chart for details).  Clinical Course    Human rabies immunoglobulin, and rabies vaccine. He will return on days 3, 7, and 14 for additional vaccinations.  Final Clinical Impressions(s) / ED Diagnoses   Final diagnoses:  Need for rabies vaccination    New Prescriptions New Prescriptions   No medications on file     Tanna Furry, MD 03/16/16 1728

## 2016-03-16 NOTE — Discharge Instructions (Signed)
He will need additional rabies vaccinations on August 5, August 9, and August 16. These can be performed at urgent care centers or emergency rooms.

## 2016-03-16 NOTE — ED Triage Notes (Signed)
Pt here for exposure to rabid [place] in June.

## 2016-03-19 ENCOUNTER — Ambulatory Visit (HOSPITAL_COMMUNITY)
Admission: EM | Admit: 2016-03-19 | Discharge: 2016-03-19 | Disposition: A | Discharge status: Home / Self Care | Payer: BC Managed Care – PPO | Attending: Surgery | Admitting: Surgery

## 2016-03-19 ENCOUNTER — Encounter (HOSPITAL_COMMUNITY): Payer: Self-pay | Admitting: *Deleted

## 2016-03-19 DIAGNOSIS — Z203 Contact with and (suspected) exposure to rabies: Secondary | ICD-10-CM | POA: Diagnosis not present

## 2016-03-19 MED ORDER — RABIES VACCINE, PCEC IM SUSR
1.0000 mL | Freq: Once | INTRAMUSCULAR | Status: AC
  Administered 2016-03-19: 1 mL via INTRAMUSCULAR

## 2016-03-19 MED ORDER — RABIES VACCINE, PCEC IM SUSR
INTRAMUSCULAR | Status: AC
  Filled 2016-03-19: qty 1

## 2016-03-19 NOTE — Discharge Instructions (Signed)
Return as soon as possible when you get back into town on 8/11 for your next dose, and finish your last dose on 8/16 as normally scheduled.

## 2016-03-19 NOTE — ED Triage Notes (Signed)
Presents for rabies vaccine. 

## 2016-03-26 ENCOUNTER — Ambulatory Visit (HOSPITAL_COMMUNITY)
Admission: EM | Admit: 2016-03-26 | Discharge: 2016-03-26 | Disposition: A | Discharge status: Home / Self Care | Payer: BC Managed Care – PPO | Attending: Surgery | Admitting: Surgery

## 2016-03-26 ENCOUNTER — Encounter (HOSPITAL_COMMUNITY): Payer: Self-pay

## 2016-03-26 DIAGNOSIS — Z203 Contact with and (suspected) exposure to rabies: Secondary | ICD-10-CM

## 2016-03-26 MED ORDER — RABIES VACCINE, PCEC IM SUSR
1.0000 mL | Freq: Once | INTRAMUSCULAR | Status: AC
  Administered 2016-03-26: 1 mL via INTRAMUSCULAR

## 2016-03-26 MED ORDER — RABIES VACCINE, PCEC IM SUSR
INTRAMUSCULAR | Status: AC
  Filled 2016-03-26: qty 1

## 2016-03-26 MED ORDER — RABIES VACCINE, PCEC IM SUSR: 1.0000 mL | Freq: Once | INTRAMUSCULAR | Status: DC

## 2016-03-26 NOTE — ED Triage Notes (Signed)
Presents for rabies vaccine. 

## 2016-03-26 NOTE — Discharge Instructions (Signed)
Return on 03/30/2016 for final injection

## 2016-03-30 ENCOUNTER — Ambulatory Visit (HOSPITAL_COMMUNITY)
Admission: EM | Admit: 2016-03-30 | Discharge: 2016-03-30 | Disposition: A | Discharge status: Home / Self Care | Payer: BC Managed Care – PPO | Attending: Family Medicine | Admitting: Family Medicine

## 2016-03-30 ENCOUNTER — Encounter (HOSPITAL_COMMUNITY): Payer: Self-pay | Admitting: *Deleted

## 2016-03-30 DIAGNOSIS — Z203 Contact with and (suspected) exposure to rabies: Secondary | ICD-10-CM | POA: Diagnosis not present

## 2016-03-30 MED ORDER — RABIES VACCINE, PCEC IM SUSR
INTRAMUSCULAR | Status: AC
  Filled 2016-03-30: qty 1

## 2016-03-30 MED ORDER — RABIES VACCINE, PCEC IM SUSR
1.0000 mL | Freq: Once | INTRAMUSCULAR | Status: AC
  Administered 2016-03-30: 1 mL via INTRAMUSCULAR

## 2016-03-30 NOTE — ED Triage Notes (Signed)
PT  IS  HERE  FOR  THE  LAST  IN  HIS  SERIES  OF  RABIES  INJECTIONS       HE  VERBALIZES  NO  SYMPTOMS

## 2016-03-30 NOTE — Discharge Instructions (Signed)
Congrats  You are  [employer]

## 2016-12-05 ENCOUNTER — Ambulatory Visit (INDEPENDENT_AMBULATORY_CARE_PROVIDER_SITE_OTHER): Payer: BC Managed Care – PPO | Admitting: Family Medicine

## 2016-12-05 ENCOUNTER — Encounter: Payer: Self-pay | Admitting: Family Medicine

## 2016-12-05 VITALS — BP 104/80 | HR 68 | Temp 97.6°F | Ht 69.5 in | Wt 211.5 lb

## 2016-12-05 DIAGNOSIS — M25561 Pain in right knee: Secondary | ICD-10-CM | POA: Insufficient documentation

## 2016-12-05 DIAGNOSIS — E669 Obesity, unspecified: Secondary | ICD-10-CM | POA: Insufficient documentation

## 2016-12-05 DIAGNOSIS — E66811 Obesity, class 1: Secondary | ICD-10-CM | POA: Insufficient documentation

## 2016-12-05 DIAGNOSIS — Z0000 Encounter for general adult medical examination without abnormal findings: Secondary | ICD-10-CM | POA: Insufficient documentation

## 2016-12-05 DIAGNOSIS — Z125 Encounter for screening for malignant neoplasm of prostate: Secondary | ICD-10-CM | POA: Diagnosis not present

## 2016-12-05 DIAGNOSIS — Z0001 Encounter for general adult medical examination with abnormal findings: Secondary | ICD-10-CM | POA: Insufficient documentation

## 2016-12-05 NOTE — Patient Instructions (Addendum)
Bring me copy of colonoscopy report to update chart. Return at your convenience fasting for labs.  I think you likely have low grade meniscal and MCL irritation - given duration of symptoms we will check MRI of R knee.  Good to meet you today.  Return for next physical in 1 year.   Health Maintenance, Male A healthy lifestyle and preventive care is important for your health and wellness. Ask your health care provider about what schedule of regular examinations is right for you. What should I know about weight and diet?  Eat a Healthy Diet  Eat plenty of vegetables, fruits, whole grains, low-fat dairy products, and lean protein.  Do not eat a lot of foods high in solid fats, added sugars, or salt. Maintain a Healthy Weight  Regular exercise can help you achieve or maintain a healthy weight. You should:  Do at least 150 minutes of exercise each week. The exercise should increase your heart rate and make you sweat (moderate-intensity exercise).  Do strength-training exercises at least twice a week. Watch Your Levels of Cholesterol and Blood Lipids  Have your blood tested for lipids and cholesterol every 5 years starting at 54 years of age. If you are at high risk for heart disease, you should start having your blood tested when you are 54 years old. You may need to have your cholesterol levels checked more often if:  Your lipid or cholesterol levels are high.  You are older than 54 years of age.  You are at high risk for heart disease. What should I know about cancer screening? Many types of cancers can be detected early and may often be prevented. Lung Cancer  You should be screened every year for lung cancer if:  You are a current smoker who has smoked for at least 30 years.  You are a former smoker who has quit within the past 15 years.  Talk to your health care provider about your screening options, when you should start screening, and how often you should be  screened. Colorectal Cancer  Routine colorectal cancer screening usually begins at 54 years of age and should be repeated every 5-10 years until you are 54 years old. You may need to be screened more often if early forms of precancerous polyps or small growths are found. Your health care provider may recommend screening at an earlier age if you have risk factors for colon cancer.  Your health care provider may recommend using home test kits to check for hidden blood in the stool.  A small camera at the end of a tube can be used to examine your colon (sigmoidoscopy or colonoscopy). This checks for the earliest forms of colorectal cancer. Prostate and Testicular Cancer  Depending on your age and overall health, your health care provider may do certain tests to screen for prostate and testicular cancer.  Talk to your health care provider about any symptoms or concerns you have about testicular or prostate cancer. Skin Cancer  Check your skin from head to toe regularly.  Tell your health care provider about any new moles or changes in moles, especially if:  There is a change in a mole's size, shape, or color.  You have a mole that is larger than a pencil eraser.  Always use sunscreen. Apply sunscreen liberally and repeat throughout the day.  Protect yourself by wearing long sleeves, pants, a wide-brimmed hat, and sunglasses when outside. What should I know about heart disease, diabetes, and high blood pressure?  If you are 30-83 years of age, have your blood pressure checked every 3-5 years. If you are 12 years of age or older, have your blood pressure checked every year. You should have your blood pressure measured twice-once when you are at a hospital or clinic, and once when you are not at a hospital or clinic. Record the average of the two measurements. To check your blood pressure when you are not at a hospital or clinic, you can use:  An automated blood pressure machine at a  pharmacy.  A home blood pressure monitor.  Talk to your health care provider about your target blood pressure.  If you are between 20-14 years old, ask your health care provider if you should take aspirin to prevent heart disease.  Have regular diabetes screenings by checking your fasting blood sugar level.  If you are at a normal weight and have a low risk for diabetes, have this test once every three years after the age of 45.  If you are overweight and have a high risk for diabetes, consider being tested at a younger age or more often.  A one-time screening for abdominal aortic aneurysm (AAA) by ultrasound is recommended for men aged 91-75 years who are current or former smokers. What should I know about preventing infection? Hepatitis B  If you have a higher risk for hepatitis B, you should be screened for this virus. Talk with your health care provider to find out if you are at risk for hepatitis B infection. Hepatitis C  Blood testing is recommended for:  Everyone born from [birth year] through 1963.  Anyone with known risk factors for hepatitis C. Sexually Transmitted Diseases (STDs)  You should be screened each year for STDs including gonorrhea and chlamydia if:  You are sexually active and are younger than 54 years of age.  You are older than 54 years of age and your health care provider tells you that you are at risk for this type of infection.  Your sexual activity has changed since you were last screened and you are at an increased risk for chlamydia or gonorrhea. Ask your health care provider if you are at risk.  Talk with your health care provider about whether you are at high risk of being infected with HIV. Your health care provider may recommend a prescription medicine to help prevent HIV infection. What else can I do?  Schedule regular health, dental, and eye exams.  Stay current with your vaccines (immunizations).  Do not use any tobacco products, such as  cigarettes, chewing tobacco, and e-cigarettes. If you need help quitting, ask your health care provider.  Limit alcohol intake to no more than 2 drinks per day. One drink equals 12 ounces of beer, 5 ounces of wine, or 1 ounces of hard liquor.  Do not use street drugs.  Do not share needles.  Ask your health care provider for help if you need support or information about quitting drugs.  Tell your health care provider if you often feel depressed.  Tell your health care provider if you have ever been abused or do not feel safe at home. This information is not intended to replace advice given to you by your health care provider. Make sure you discuss any questions you have with your health care provider. Document Released: 01/28/2008 Document Revised: 03/30/2016 Document Reviewed: 05/05/2015 Elsevier Interactive Patient Education  2017 [employer].

## 2016-12-05 NOTE — Progress Notes (Addendum)
BP 104/80   Pulse 68   Temp 97.6 F (36.4 C) (Oral)   Ht 5' 9.5" (1.765 m)   Wt 211 lb 8 oz (95.9 kg)   BMI 30.79 kg/m    CC: new pt to establish care Subjective:    Patient ID: Barry Wilcox, male    DOB: 1961/03/14, 56 y.o.   MRN: [id number]  HPI: [name] is a 56 y.o. male presenting on 12/05/2016 for Establish Care   Would like CPE R knee pain since training for marathon - July 2017 [facility]. Onset while doing sprints up hill. Ongoing over the past year, worse with certain twisting motions. No locking of knee. + instability of knee. No trouble going up or down stairs. Treating with knee sleeve which helps. OTC analgesics haven't helped. Has not had PT.   Preventative: Colonoscopy 2012 WNL [facility] Prostate cancer screening - fmhx prostate cancer.  Flu shot yearly Tdap 12/2013 Seat belt use discussed Sunscreen use discussed. No changing moles on skin. Non smoker Rare alcohol  Lives with wife [name] and daughter, 3 dogs and 3 cats  Edu: PhD  Occ: Games developer think Engineer, manufacturing systems  Activity: works out at parks weekday mornings ([place])  Diet: good water, fruits/vegetables daily   Relevant past medical, surgical, family and social history reviewed and updated as indicated. Interim medical history since our last visit reviewed. Allergies and medications reviewed and updated. Outpatient Medications Prior to Visit  Medication Sig Dispense Refill  . loratadine (CLARITIN) 10 MG tablet Take 10 mg by mouth daily as needed for allergies.    Marland Kitchen ketotifen (ALAWAY) 0.025 % ophthalmic solution Place 1 drop into both eyes daily as needed.    . mometasone (NASONEX) 50 MCG/ACT nasal spray Place 2 sprays into the nose daily as needed.    Marland Kitchen albuterol (PROVENTIL HFA;VENTOLIN HFA) 108 (90 BASE) MCG/ACT inhaler Inhale 2 puffs into the lungs every 6 (six) hours as needed (Allergic asthma).    Marland Kitchen amoxicillin-clavulanate  (AUGMENTIN) 875-125 MG per tablet Take 1 tablet by mouth 2 (two) times daily. 12 tablet 0  . neomycin-bacitracin-polymyxin (NEOSPORIN) OINT Apply 1 application topically 2 (two) times daily. 30 g 0   No facility-administered medications prior to visit.      Per HPI unless specifically indicated in ROS section below Review of Systems  Constitutional: Negative for activity change, appetite change, chills, fatigue, fever and unexpected weight change.  HENT: Negative for hearing loss.   Eyes: Negative for visual disturbance.  Respiratory: Negative for cough, chest tightness, shortness of breath and wheezing.   Cardiovascular: Negative for chest pain, palpitations and leg swelling.  Gastrointestinal: Negative for abdominal distention, abdominal pain, blood in stool, constipation, diarrhea, nausea and vomiting.  Genitourinary: Negative for difficulty urinating and hematuria.  Musculoskeletal: Negative for arthralgias, myalgias and neck pain.  Skin: Negative for rash.  Neurological: Negative for dizziness, seizures, syncope and headaches.  Hematological: Negative for adenopathy. Does not bruise/bleed easily.  Psychiatric/Behavioral: Negative for dysphoric mood. The patient is not nervous/anxious.        Objective:    BP 104/80   Pulse 68   Temp 97.6 F (36.4 C) (Oral)   Ht 5' 9.5" (1.765 m)   Wt 211 lb 8 oz (95.9 kg)   BMI 30.79 kg/m   Wt Readings from Last 3 Encounters:  12/05/16 211 lb 8 oz (95.9 kg)  03/16/16 201 lb (91.2 kg)  01/07/14 189 lb 8 oz (  86 kg)    Physical Exam  Constitutional: He is oriented to person, place, and time. He appears well-developed and well-nourished. No distress.  HENT:  Head: Normocephalic and atraumatic.  Right Ear: Hearing, tympanic membrane, external ear and ear canal normal.  Left Ear: Hearing, tympanic membrane, external ear and ear canal normal.  Nose: Nose normal.  Mouth/Throat: Uvula is midline, oropharynx is clear and moist and mucous  membranes are normal. No oropharyngeal exudate, posterior oropharyngeal edema or posterior oropharyngeal erythema.  Eyes: Conjunctivae and EOM are normal. Pupils are equal, round, and reactive to light. No scleral icterus.  Neck: Normal range of motion. Neck supple. No thyromegaly present.  Cardiovascular: Normal rate, regular rhythm, normal heart sounds and intact distal pulses.   No murmur heard. Pulses:      Radial pulses are 2+ on the right side, and 2+ on the left side.  Pulmonary/Chest: Effort normal and breath sounds normal. No respiratory distress. He has no wheezes. He has no rales.  Abdominal: Soft. Bowel sounds are normal. He exhibits no distension and no mass. There is no tenderness. There is no rebound and no guarding.  Genitourinary: Rectum normal and prostate normal. Rectal exam shows no external hemorrhoid, no internal hemorrhoid, no fissure, no mass, no tenderness and anal tone normal. Prostate is not enlarged (15gm) and not tender.  Musculoskeletal: Normal range of motion. He exhibits no edema.  L knee WNL R Knee exam: No deformity on inspection. No significant pain with palpation of knee landmarks. No effusion/swelling noted. FROM in flex/extension without crepitus. No popliteal fullness. Neg drawer test. + mcmurray test. ++ pain with valgus stress. No PFgrind. No abnormal patellar mobility.   Lymphadenopathy:    He has no cervical adenopathy.  Neurological: He is alert and oriented to person, place, and time.  CN grossly intact, station and gait intact  Skin: Skin is warm and dry. No rash noted.  Psychiatric: He has a normal mood and affect. His behavior is normal. Judgment and thought content normal.  Nursing note and vitals reviewed.     Assessment & Plan:   Problem List Items Addressed This Visit    Health maintenance examination - Primary    Preventative protocols reviewed and updated unless pt declined. Discussed healthy diet and lifestyle.        Obesity, Class I, BMI 30-34.9    Discussed healthy diet and lifestyle changes to affect sustainable weight loss.       Relevant Orders   Lipid panel   Comprehensive metabolic panel   TSH   Right medial knee pain    Anticipate meniscal and MCL tear/irritation in this overall very healthy 56 yo.  Knee instability with ambulation. Ongoing over 9 months. Limiting ability to train as he would like.  Will check knee MRI to further evaluate for chronic meniscal and MCL tear and see if would benefit from surgical intervention. Pt agrees with plan.       Relevant Orders   MR Knee Right Wo Contrast    Other Visit Diagnoses    Special screening for malignant neoplasm of prostate       Relevant Orders   PSA       Follow up plan: Return in about 1 year (around 12/05/2017) for annual exam, prior fasting for blood work.  Ria Bush, MD

## 2016-12-05 NOTE — Progress Notes (Signed)
Pre visit review using our clinic review tool, if applicable. No additional management support is needed unless otherwise documented below in the visit note. 

## 2016-12-05 NOTE — Assessment & Plan Note (Signed)
Discussed healthy diet and lifestyle changes to affect sustainable weight loss  

## 2016-12-05 NOTE — Assessment & Plan Note (Signed)
Anticipate meniscal and MCL tear/irritation in this overall very healthy 56 yo.  Knee instability with ambulation. Ongoing over 9 months. Limiting ability to train as he would like.  Will check knee MRI to further evaluate for chronic meniscal and MCL tear and see if would benefit from surgical intervention. Pt agrees with plan.

## 2016-12-05 NOTE — Assessment & Plan Note (Signed)
Preventative protocols reviewed and updated unless pt declined. Discussed healthy diet and lifestyle.  

## 2016-12-06 ENCOUNTER — Other Ambulatory Visit: Payer: BC Managed Care – PPO

## 2016-12-09 ENCOUNTER — Other Ambulatory Visit (INDEPENDENT_AMBULATORY_CARE_PROVIDER_SITE_OTHER): Payer: BC Managed Care – PPO

## 2016-12-09 DIAGNOSIS — E669 Obesity, unspecified: Secondary | ICD-10-CM | POA: Diagnosis not present

## 2016-12-09 DIAGNOSIS — Z125 Encounter for screening for malignant neoplasm of prostate: Secondary | ICD-10-CM | POA: Diagnosis not present

## 2016-12-09 LAB — COMPREHENSIVE METABOLIC PANEL
ALT: 13 U/L (ref 0–53)
AST: 17 U/L (ref 0–37)
Albumin: 4 g/dL (ref 3.5–5.2)
Alkaline Phosphatase: 62 U/L (ref 39–117)
BILIRUBIN TOTAL: 0.4 mg/dL (ref 0.2–1.2)
BUN: 24 mg/dL — AB (ref 6–23)
CO2: 26 meq/L (ref 19–32)
CREATININE: 1.41 mg/dL (ref 0.40–1.50)
Calcium: 9.2 mg/dL (ref 8.4–10.5)
Chloride: 106 mEq/L (ref 96–112)
GFR: 55.76 mL/min — ABNORMAL LOW (ref >60.00)
GLUCOSE: 98 mg/dL (ref 70–99)
Potassium: 4.5 mEq/L (ref 3.5–5.1)
SODIUM: 140 meq/L (ref 135–145)
Total Protein: 6.3 g/dL (ref 6.0–8.3)

## 2016-12-09 LAB — TSH: TSH: 2.78 u[IU]/mL (ref 0.35–4.50)

## 2016-12-09 LAB — PSA: PSA: 3.02 ng/mL (ref 0.10–4.00)

## 2016-12-09 LAB — LIPID PANEL
CHOL/HDL RATIO: 4
Cholesterol: 178 mg/dL (ref 0–200)
HDL: 43.6 mg/dL (ref >39.00)
LDL Cholesterol: 115 mg/dL — ABNORMAL HIGH (ref 0–99)
NONHDL: 134.87
Triglycerides: 98 mg/dL (ref 0.0–149.0)
VLDL: 19.6 mg/dL (ref 0.0–40.0)

## 2016-12-12 ENCOUNTER — Encounter: Payer: Self-pay | Admitting: *Deleted

## 2016-12-13 ENCOUNTER — Other Ambulatory Visit: Payer: BC Managed Care – PPO

## 2017-12-08 ENCOUNTER — Encounter: Payer: Self-pay | Admitting: Family Medicine

## 2017-12-08 ENCOUNTER — Ambulatory Visit (INDEPENDENT_AMBULATORY_CARE_PROVIDER_SITE_OTHER): Payer: BC Managed Care – PPO | Admitting: Family Medicine

## 2017-12-08 VITALS — BP 118/82 | HR 67 | Temp 97.8°F | Ht 69.5 in | Wt 216.2 lb

## 2017-12-08 DIAGNOSIS — R12 Heartburn: Secondary | ICD-10-CM | POA: Diagnosis not present

## 2017-12-08 DIAGNOSIS — E7800 Pure hypercholesterolemia, unspecified: Secondary | ICD-10-CM | POA: Diagnosis not present

## 2017-12-08 DIAGNOSIS — E669 Obesity, unspecified: Secondary | ICD-10-CM

## 2017-12-08 DIAGNOSIS — K219 Gastro-esophageal reflux disease without esophagitis: Secondary | ICD-10-CM | POA: Insufficient documentation

## 2017-12-08 DIAGNOSIS — Z125 Encounter for screening for malignant neoplasm of prostate: Secondary | ICD-10-CM | POA: Diagnosis not present

## 2017-12-08 DIAGNOSIS — Z0000 Encounter for general adult medical examination without abnormal findings: Secondary | ICD-10-CM

## 2017-12-08 DIAGNOSIS — E66811 Obesity, class 1: Secondary | ICD-10-CM

## 2017-12-08 LAB — BASIC METABOLIC PANEL
BUN: 15 mg/dL (ref 6–23)
CHLORIDE: 103 meq/L (ref 96–112)
CO2: 32 mEq/L (ref 19–32)
CREATININE: 1.45 mg/dL (ref 0.40–1.50)
Calcium: 9.3 mg/dL (ref 8.4–10.5)
GFR: 53.78 mL/min — AB (ref >60.00)
GLUCOSE: 94 mg/dL (ref 70–99)
Potassium: 4.6 mEq/L (ref 3.5–5.1)
Sodium: 141 mEq/L (ref 135–145)

## 2017-12-08 LAB — LIPID PANEL
CHOL/HDL RATIO: 4
CHOLESTEROL: 188 mg/dL (ref 0–200)
HDL: 47.4 mg/dL (ref >39.00)
LDL CALC: 112 mg/dL — AB (ref 0–99)
NonHDL: 140.48
Triglycerides: 141 mg/dL (ref 0.0–149.0)
VLDL: 28.2 mg/dL (ref 0.0–40.0)

## 2017-12-08 LAB — PSA: PSA: 3.73 ng/mL (ref 0.10–4.00)

## 2017-12-08 NOTE — Assessment & Plan Note (Signed)
Reviewed healthy diet and lifestyle choices.

## 2017-12-08 NOTE — Assessment & Plan Note (Signed)
Reviewed diet and lifestyle changes to help control this. Ok to use OTC tums, zantac, prilosec OTC etc.

## 2017-12-08 NOTE — Progress Notes (Addendum)
BP 118/82 (BP Location: Left Arm, Patient Position: Sitting, Cuff Size: Normal)   Pulse 67   Temp 97.8 F (36.6 C) (Oral)   Ht 5' 9.5" (1.765 m)   Wt 216 lb 4 oz (98.1 kg)   SpO2 97%   BMI 31.48 kg/m    CC: CPE Subjective:    Patient ID: Barry Wilcox, male    DOB: [birth year], 57 y.o.   MRN: [id number]  HPI: Barry Wilcox is a 57 y.o. male presenting on 12/08/2017 for Annual Exam   Knee pain improved after decreasing running. S/p grandfather marathon 02/2016. GERD - some increasing symptoms of heartburn. Treats with PRN tums.   Preventative: Colonoscopy 2012 WNL [facility]) Prostate cancer screening - fmhx prostate cancer.  Flu shot yearly Tdap 12/2013 Seat belt use discussed Sunscreen use discussed. No changing moles on skin.  Sees dentist and eye doctor regularly.  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 Interim medical history since our last visit reviewed. Allergies and medications reviewed and updated. Outpatient Medications Prior to Visit  Medication Sig Dispense Refill  . loratadine (CLARITIN) 10 MG tablet Take 10 mg by mouth daily as needed for allergies.    . mometasone (NASONEX) 50 MCG/ACT nasal spray Place 2 sprays into the nose daily as needed.     No facility-administered medications prior to visit.      Per HPI unless specifically indicated in ROS section below Review of Systems  Constitutional: Negative for activity change, appetite change, chills, fatigue, fever and unexpected weight change.  HENT: Negative for hearing loss.   Eyes: Negative for visual disturbance.  Respiratory: Negative for cough, chest  tightness, shortness of breath and wheezing.   Cardiovascular: Negative for chest pain, palpitations and leg swelling.  Gastrointestinal: Negative for abdominal distention, abdominal pain, blood in stool, constipation, diarrhea, nausea and vomiting.  Genitourinary: Negative for difficulty urinating and hematuria.  Musculoskeletal: Negative for arthralgias, myalgias and neck pain.  Skin: Negative for rash.  Neurological: Negative for dizziness, seizures, syncope and headaches.  Hematological: Negative for adenopathy. Does not bruise/bleed easily.  Psychiatric/Behavioral: Negative for dysphoric mood. The patient is not nervous/anxious.        Objective:    BP 118/82 (BP Location: Left Arm, Patient Position: Sitting, Cuff Size: Normal)   Pulse 67   Temp 97.8 F (36.6 C) (Oral)   Ht 5' 9.5" (1.765 m)   Wt 216 lb 4 oz (98.1 kg)   SpO2 97%   BMI 31.48 kg/m   Wt Readings from Last 3 Encounters:  12/08/17 216 lb 4 oz (98.1 kg)  12/05/16 211 lb 8 oz (95.9 kg)  03/16/16 201 lb (91.2 kg)    Physical Exam  Constitutional: He is oriented to person, place, and time. He appears well-developed and well-nourished. No distress.  HENT:  Head: Normocephalic and atraumatic.  Right Ear: Hearing, tympanic membrane, external ear and ear canal normal.  Left Ear: Hearing, tympanic membrane, external ear and ear canal normal.  Nose: Nose normal.  Mouth/Throat: Uvula is midline, oropharynx is clear and moist and mucous membranes are normal. No oropharyngeal exudate, posterior oropharyngeal edema or posterior oropharyngeal erythema.  Eyes:  Pupils are equal, round, and reactive to light. Conjunctivae and EOM are normal. No scleral icterus.  Neck: Normal range of motion. Neck supple. No thyromegaly present.  Cardiovascular: Normal rate, regular rhythm, normal heart sounds and intact distal pulses.  No murmur heard. Pulses:      Radial pulses are 2+ on the right side, and 2+ on the left side.    Pulmonary/Chest: Effort normal and breath sounds normal. No respiratory distress. He has no wheezes. He has no rales.  Abdominal: Soft. Bowel sounds are normal. He exhibits no distension and no mass. There is no tenderness. There is no rebound and no guarding.  Genitourinary: Rectum normal and prostate normal. Rectal exam shows no external hemorrhoid, no internal hemorrhoid, no fissure, no mass, no tenderness and anal tone normal. Prostate is not enlarged (20gm) and not tender.  Musculoskeletal: Normal range of motion. He exhibits no edema.  Lymphadenopathy:    He has no cervical adenopathy.  Neurological: He is alert and oriented to person, place, and time.  CN grossly intact, station and gait intact  Skin: Skin is warm and dry. No rash noted.  Psychiatric: He has a normal mood and affect. His behavior is normal. Judgment and thought content normal.  Nursing note and vitals reviewed.  Results for orders placed or performed in visit on 12/09/16  Lipid panel  Result Value Ref Range   Cholesterol 178 0 - 200 mg/dL   Triglycerides 98.0 0.0 - 149.0 mg/dL   HDL 43.60 >39.00 mg/dL   VLDL 19.6 0.0 - 40.0 mg/dL   LDL Cholesterol 115 (H) 0 - 99 mg/dL   Total CHOL/HDL Ratio 4    NonHDL 134.87   Comprehensive metabolic panel  Result Value Ref Range   Sodium 140 135 - 145 mEq/L   Potassium 4.5 3.5 - 5.1 mEq/L   Chloride 106 96 - 112 mEq/L   CO2 26 19 - 32 mEq/L   Glucose, Bld 98 70 - 99 mg/dL   BUN 24 (H) 6 - 23 mg/dL   Creatinine, Ser 1.41 0.40 - 1.50 mg/dL   Total Bilirubin 0.4 0.2 - 1.2 mg/dL   Alkaline Phosphatase 62 39 - 117 U/L   AST 17 0 - 37 U/L   ALT 13 0 - 53 U/L   Total Protein 6.3 6.0 - 8.3 g/dL   Albumin 4.0 3.5 - 5.2 g/dL   Calcium 9.2 8.4 - 10.5 mg/dL   GFR 55.76 (L) >60.00 mL/min  TSH  Result Value Ref Range   TSH 2.78 0.35 - 4.50 uIU/mL  PSA  Result Value Ref Range   PSA 3.02 0.10 - 4.00 ng/mL      Assessment & Plan:   Problem List Items Addressed This Visit     Health maintenance examination - Primary    Preventative protocols reviewed and updated unless pt declined. Discussed healthy diet and lifestyle.       Heartburn    Reviewed diet and lifestyle changes to help control this. Ok to use OTC tums, zantac, prilosec OTC etc.       Obesity, Class I, BMI 30-34.9    Reviewed healthy diet and lifestyle choices.       Pure hypercholesterolemia    Stable off meds. The 10-year ASCVD risk score Mikey Bussing DC Brooke Bonito., et al., 2013) is: 4.4%   Values used to calculate the score:     Age: 72 years     Sex: Male     Is Non-Hispanic African American: No  Diabetic: No     Tobacco smoker: No     Systolic Blood Pressure: 349 mmHg     Is BP treated: No     HDL Cholesterol: 43.6 mg/dL     Total Cholesterol: 178 mg/dL       Relevant Orders   Lipid panel   Basic metabolic panel    Other Visit Diagnoses    Special screening for malignant neoplasm of prostate       Relevant Orders   PSA       No orders of the defined types were placed in this encounter.  Orders Placed This Encounter  Procedures  . Lipid panel  . Basic metabolic panel  . PSA    Follow up plan: Return in about 1 year (around 12/09/2018) for annual exam, prior fasting for blood work.  Ria Bush, MD

## 2017-12-08 NOTE — Patient Instructions (Signed)
Labs today You are doing well today. Continue healthy diet and regular exercise routine Return as needed or in 1 year for next physical.  Health Maintenance, Male A healthy lifestyle and preventive care is important for your health and wellness. Ask your health care provider about what schedule of regular examinations is right for you. What should I know about weight and diet? Eat a Healthy Diet  Eat plenty of vegetables, fruits, whole grains, low-fat dairy products, and lean protein.  Do not eat a lot of foods high in solid fats, added sugars, or salt.  Maintain a Healthy Weight Regular exercise can help you achieve or maintain a healthy weight. You should:  Do at least 150 minutes of exercise each week. The exercise should increase your heart rate and make you sweat (moderate-intensity exercise).  Do strength-training exercises at least twice a week.  Watch Your Levels of Cholesterol and Blood Lipids  Have your blood tested for lipids and cholesterol every 5 years starting at 55 years of age. If you are at high risk for heart disease, you should start having your blood tested when you are 54 years old. You may need to have your cholesterol levels checked more often if: ? Your lipid or cholesterol levels are high. ? You are older than 55 years of age. ? You are at high risk for heart disease.  What should I know about cancer screening? Many types of cancers can be detected early and may often be prevented. Lung Cancer  You should be screened every year for lung cancer if: ? You are a current smoker who has smoked for at least 30 years. ? You are a former smoker who has quit within the past 15 years.  Talk to your health care provider about your screening options, when you should start screening, and how often you should be screened.  Colorectal Cancer  Routine colorectal cancer screening usually begins at 55 years of age and should be repeated every 5-10 years until you are  55 years old. You may need to be screened more often if early forms of precancerous polyps or small growths are found. Your health care provider may recommend screening at an earlier age if you have risk factors for colon cancer.  Your health care provider may recommend using home test kits to check for hidden blood in the stool.  A small camera at the end of a tube can be used to examine your colon (sigmoidoscopy or colonoscopy). This checks for the earliest forms of colorectal cancer.  Prostate and Testicular Cancer  Depending on your age and overall health, your health care provider may do certain tests to screen for prostate and testicular cancer.  Talk to your health care provider about any symptoms or concerns you have about testicular or prostate cancer.  Skin Cancer  Check your skin from head to toe regularly.  Tell your health care provider about any new moles or changes in moles, especially if: ? There is a change in a mole's size, shape, or color. ? You have a mole that is larger than a pencil eraser.  Always use sunscreen. Apply sunscreen liberally and repeat throughout the day.  Protect yourself by wearing long sleeves, pants, a wide-brimmed hat, and sunglasses when outside.  What should I know about heart disease, diabetes, and high blood pressure?  If you are 65-38 years of age, have your blood pressure checked every 3-5 years. If you are 23 years of age or older, have  your blood pressure checked every year. You should have your blood pressure measured twice-once when you are at a hospital or clinic, and once when you are not at a hospital or clinic. Record the average of the two measurements. To check your blood pressure when you are not at a hospital or clinic, you can use: ? An automated blood pressure machine at a pharmacy. ? A home blood pressure monitor.  Talk to your health care provider about your target blood pressure.  If you are between 16-89 years old, ask  your health care provider if you should take aspirin to prevent heart disease.  Have regular diabetes screenings by checking your fasting blood sugar level. ? If you are at a normal weight and have a low risk for diabetes, have this test once every three years after the age of 90. ? If you are overweight and have a high risk for diabetes, consider being tested at a younger age or more often.  A one-time screening for abdominal aortic aneurysm (AAA) by ultrasound is recommended for men aged 55-75 years who are current or former smokers. What should I know about preventing infection? Hepatitis B If you have a higher risk for hepatitis B, you should be screened for this virus. Talk with your health care provider to find out if you are at risk for hepatitis B infection. Hepatitis C Blood testing is recommended for:  Everyone born from [birth year] through 1963.  Anyone with known risk factors for hepatitis C.  Sexually Transmitted Diseases (STDs)  You should be screened each year for STDs including gonorrhea and chlamydia if: ? You are sexually active and are younger than 55 years of age. ? You are older than 55 years of age and your health care provider tells you that you are at risk for this type of infection. ? Your sexual activity has changed since you were last screened and you are at an increased risk for chlamydia or gonorrhea. Ask your health care provider if you are at risk.  Talk with your health care provider about whether you are at high risk of being infected with HIV. Your health care provider may recommend a prescription medicine to help prevent HIV infection.  What else can I do?  Schedule regular health, dental, and eye exams.  Stay current with your vaccines (immunizations).  Do not use any tobacco products, such as cigarettes, chewing tobacco, and e-cigarettes. If you need help quitting, ask your health care provider.  Limit alcohol intake to no more than 2 drinks per day.  One drink equals 12 ounces of beer, 5 ounces of wine, or 1 ounces of hard liquor.  Do not use street drugs.  Do not share needles.  Ask your health care provider for help if you need support or information about quitting drugs.  Tell your health care provider if you often feel depressed.  Tell your health care provider if you have ever been abused or do not feel safe at home. This information is not intended to replace advice given to you by your health care provider. Make sure you discuss any questions you have with your health care provider. Document Released: 01/28/2008 Document Revised: 03/30/2016 Document Reviewed: 05/05/2015 Elsevier Interactive Patient Education  [employer].

## 2017-12-08 NOTE — Assessment & Plan Note (Signed)
Preventative protocols reviewed and updated unless pt declined. Discussed healthy diet and lifestyle.  

## 2017-12-08 NOTE — Assessment & Plan Note (Signed)
Stable off meds. The 10-year ASCVD risk score Mikey Bussing DC Brooke Bonito., et al., 2013) is: 4.4%   Values used to calculate the score:     Age: 57 years     Sex: Male     Is Non-Hispanic African American: No     Diabetic: No     Tobacco smoker: No     Systolic Blood Pressure: 703 mmHg     Is BP treated: No     HDL Cholesterol: 43.6 mg/dL     Total Cholesterol: 178 mg/dL

## 2018-06-15 DIAGNOSIS — C4491 Basal cell carcinoma of skin, unspecified: Secondary | ICD-10-CM

## 2018-06-15 HISTORY — DX: Basal cell carcinoma of skin, unspecified: C44.91

## 2018-07-15 ENCOUNTER — Encounter: Payer: Self-pay | Admitting: Family Medicine

## 2018-12-11 ENCOUNTER — Other Ambulatory Visit: Payer: BC Managed Care – PPO

## 2018-12-14 ENCOUNTER — Encounter: Payer: BC Managed Care – PPO | Admitting: Family Medicine

## 2019-03-02 ENCOUNTER — Other Ambulatory Visit: Payer: Self-pay

## 2019-03-02 ENCOUNTER — Encounter: Payer: Self-pay | Admitting: Emergency Medicine

## 2019-03-02 DIAGNOSIS — W268XXA Contact with other sharp object(s), not elsewhere classified, initial encounter: Secondary | ICD-10-CM | POA: Diagnosis not present

## 2019-03-02 DIAGNOSIS — Y92013 Bedroom of single-family (private) house as the place of occurrence of the external cause: Secondary | ICD-10-CM | POA: Diagnosis not present

## 2019-03-02 DIAGNOSIS — Y999 Unspecified external cause status: Secondary | ICD-10-CM | POA: Insufficient documentation

## 2019-03-02 DIAGNOSIS — Y93E9 Activity, other interior property and clothing maintenance: Secondary | ICD-10-CM | POA: Insufficient documentation

## 2019-03-02 DIAGNOSIS — Z23 Encounter for immunization: Secondary | ICD-10-CM | POA: Insufficient documentation

## 2019-03-02 DIAGNOSIS — S8991XA Unspecified injury of right lower leg, initial encounter: Secondary | ICD-10-CM | POA: Diagnosis present

## 2019-03-02 DIAGNOSIS — S81811A Laceration without foreign body, right lower leg, initial encounter: Secondary | ICD-10-CM | POA: Insufficient documentation

## 2019-03-02 NOTE — ED Triage Notes (Addendum)
Pt says he was putting a ceiling fan in his daughter's room tonight when he got a cut on his right leg from the box; about 2 inch laceration to the back of right calf; started bleeding when dressing removed; pressure dressing applied

## 2019-03-03 ENCOUNTER — Emergency Department
Admission: EM | Admit: 2019-03-03 | Discharge: 2019-03-03 | Disposition: A | Discharge status: Home / Self Care | Payer: BC Managed Care – PPO | Attending: Emergency Medicine | Admitting: Emergency Medicine

## 2019-03-03 DIAGNOSIS — S81811A Laceration without foreign body, right lower leg, initial encounter: Secondary | ICD-10-CM

## 2019-03-03 MED ORDER — TETANUS-DIPHTH-ACELL PERTUSSIS 5-2.5-18.5 LF-MCG/0.5 IM SUSP
0.5000 mL | Freq: Once | INTRAMUSCULAR | Status: AC
  Administered 2019-03-03: 0.5 mL via INTRAMUSCULAR
  Filled 2019-03-03: qty 0.5

## 2019-03-03 NOTE — ED Provider Notes (Signed)
[facility] Emergency Department Provider Note   ____________________________________________   First MD Initiated Contact with Patient 03/03/19 0151     (approximate)  I have reviewed the triage vital signs and the nursing notes.   HISTORY  Chief Complaint Laceration    HPI Barry Wilcox is a 58 y.o. male who presents to the ED from home with a chief complaint of right leg laceration.  Patient was putting a vent in his daughter's room when he got a cut on his right calf from the edge of the box.  Tetanus is not up-to-date.  Patient does not take anticoagulants.  Voices no other complaints or injuries other than right leg laceration.       Past Medical History:  Diagnosis Date  . BCC (basal cell carcinoma of skin) 06/2018   L jaw, presumed s/p biopsy and removal (Dr [facility]l [facility])  . Cellulitis 12/2013   LUE "cat bite"  . History of chicken pox   . History of kidney stones    x2    Patient Active Problem List   Diagnosis Date Noted  . Pure hypercholesterolemia 12/08/2017  . Heartburn 12/08/2017  . Health maintenance examination 12/05/2016  . Right medial knee pain 12/05/2016  . Obesity, Class I, BMI 30-34.9 12/05/2016    Past Surgical History:  Procedure Laterality Date  . COLONOSCOPY  2012   Joelyn Oms)  . SHOULDER ARTHROSCOPY W/ ROTATOR CUFF REPAIR Left 2007    Prior to Admission medications   Medication Sig Start Date End Date Taking? Authorizing Provider  loratadine (CLARITIN) 10 MG tablet Take 10 mg by mouth daily as needed for allergies.    [provider]  mometasone (NASONEX) 50 MCG/ACT nasal spray Place 2 sprays into the nose daily as needed.    [provider]    Allergies Patient has no known allergies.  Family History  Problem Relation Age of Onset  . Diabetes Mother   . Cancer Father 35       prostate  . CAD Neg Hx   . Stroke Neg Hx     Social History Social History   Tobacco Use   . Smoking status: Never Smoker  . Smokeless tobacco: Never Used  Substance Use Topics  . Alcohol use: Yes    Comment: 01/08/2014 "glass of wine maybe 4 times/yr"  . Drug use: No    Review of Systems  Constitutional: No fever/chills Eyes: No visual changes. ENT: No sore throat. Cardiovascular: Denies chest pain. Respiratory: Denies shortness of breath. Gastrointestinal: No abdominal pain.  No nausea, no vomiting.  No diarrhea.  No constipation. Genitourinary: Negative for dysuria. Musculoskeletal: Positive for right leg laceration.  Negative for back pain. Skin: Negative for rash. Neurological: Negative for headaches, focal weakness or numbness.   ____________________________________________   PHYSICAL EXAM:  VITAL SIGNS: ED Triage Vitals  Enc Vitals Group     BP 03/02/19 2158 127/79     Pulse Rate 03/02/19 2158 71     Resp 03/02/19 2158 16     Temp 03/02/19 2158 98.2 F (36.8 C)     Temp Source 03/02/19 2158 Oral     SpO2 03/02/19 2158 95 %     Weight 03/02/19 2159 205 lb 14.6 oz (93.4 kg)     Height 03/02/19 2159 5\' 10"  (1.778 m)     Head Circumference --      Peak Flow --      Pain Score 03/02/19 2158 0  Pain Loc --      Pain Edu? --      Excl. in [place]? --     Constitutional: Alert and oriented.  Pleasant, well appearing and in no acute distress. Eyes: Conjunctivae are normal. PERRL. EOMI. Head: Atraumatic. Nose: Atraumatic. Mouth/Throat: Mucous membranes are moist.  No dental malocclusion. Neck: No stridor.  No cervical spine tenderness to palpation. Cardiovascular: Normal rate, regular rhythm. Grossly normal heart sounds.  Good peripheral circulation. Respiratory: Normal respiratory effort.  No retractions. Lungs CTAB. Gastrointestinal: Soft and nontender. No distention. No abdominal bruits. No CVA tenderness. Musculoskeletal:  RLE: Approximately a 7 cm linear and superficial laceration to adipose tissue to right posterior/lateral calf.  Wound is well  approximated and not bleeding.  2+ distal pulses.  Calf is supple without swelling or tenderness. Neurologic:  Normal speech and language. No gross focal neurologic deficits are appreciated. No gait instability. Skin:  Skin is warm, dry and intact. No rash noted. Psychiatric: Mood and affect are normal. Speech and behavior are normal.  ____________________________________________   LABS (all labs ordered are listed, but only abnormal results are displayed)  Labs Reviewed - No data to display ____________________________________________  EKG  None ____________________________________________  RADIOLOGY  ED MD interpretation: None  Official radiology report(s): No results found.  ____________________________________________   PROCEDURES  Procedure(s) performed (including Critical Care):  Procedures  LACERATION REPAIR Performed by: Paulette Blanch Authorized by: Paulette Blanch Consent: Verbal consent obtained. Risks and benefits: risks, benefits and alternatives were discussed Consent given by: patient Patient identity confirmed: provided demographic data Prepped and Draped in normal sterile fashion Wound explored  Laceration Location: Right calf  Laceration Length: 7cm  No Foreign Bodies seen or palpated  Anesthesia: local infiltration  Local anesthetic: lidocaine 2% with epinephrine  Anesthetic total: 5 ml  Irrigation method: syringe Amount of cleaning: standard  Skin closure: 4-0 Nylon  Number of sutures: Continuous  Technique: Sterile standard technique  Patient tolerance: Patient tolerated the procedure well with no immediate complications. ____________________________________________   INITIAL IMPRESSION / ASSESSMENT AND PLAN / ED COURSE  As part of my medical decision making, I reviewed the following data within the [facility] notes reviewed and incorporated and Notes from prior ED visits     Barry Wilcox was  evaluated in Emergency Department on 03/03/2019 for the symptoms described in the history of present illness. He was evaluated in the context of the global COVID-19 pandemic, which necessitated consideration that the patient might be at risk for infection with the SARS-CoV-2 virus that causes COVID-19. Institutional protocols and algorithms that pertain to the evaluation of patients at risk for COVID-19 are in a state of rapid change based on information released by regulatory bodies including the CDC and federal and state organizations. These policies and algorithms were followed during the patient's care in the ED.   58 year old male who presents with right calf laceration.  Tolerated suture repair well.  Will update tetanus.  Strict return precautions given.  Patient verbalizes understanding agrees with plan of care.      ____________________________________________   FINAL CLINICAL IMPRESSION(S) / ED DIAGNOSES  Final diagnoses:  Leg laceration, right, initial encounter     ED Discharge Orders    None       Note:  This document was prepared using Dragon voice recognition software and may include unintentional dictation errors.   Paulette Blanch, MD 03/03/19 [id number]

## 2020-09-04 ENCOUNTER — Ambulatory Visit (INDEPENDENT_AMBULATORY_CARE_PROVIDER_SITE_OTHER): Payer: BC Managed Care – PPO | Admitting: Family Medicine

## 2020-09-04 ENCOUNTER — Other Ambulatory Visit: Payer: Self-pay

## 2020-09-04 ENCOUNTER — Encounter: Payer: Self-pay | Admitting: Family Medicine

## 2020-09-04 VITALS — BP 118/72 | HR 77 | Temp 97.7°F | Ht 64.0 in | Wt 229.2 lb

## 2020-09-04 DIAGNOSIS — R053 Chronic cough: Secondary | ICD-10-CM | POA: Diagnosis not present

## 2020-09-04 DIAGNOSIS — Z23 Encounter for immunization: Secondary | ICD-10-CM

## 2020-09-04 DIAGNOSIS — R12 Heartburn: Secondary | ICD-10-CM

## 2020-09-04 MED ORDER — PREDNISONE 20 MG PO TABS: ORAL_TABLET | ORAL | 0 refills | Status: DC

## 2020-09-04 MED ORDER — BENZONATATE 100 MG PO CAPS: 100.0000 mg | ORAL_CAPSULE | Freq: Three times a day (TID) | ORAL | 0 refills | Status: DC | PRN

## 2020-09-04 MED ORDER — OMEPRAZOLE 40 MG PO CPDR: 40.0000 mg | DELAYED_RELEASE_CAPSULE | Freq: Every day | ORAL | 1 refills | Status: DC

## 2020-09-04 NOTE — Progress Notes (Signed)
Patient ID: Barry Wilcox, male    DOB: 10-05-1960, 60 y.o.   MRN: [id number]  This visit was conducted in person.  BP 118/72 (BP Location: Left Arm, Patient Position: Sitting, Cuff Size: Large)   Pulse 77   Temp 97.7 F (36.5 C) (Temporal)   Ht 5\' 4"  (1.626 m)   Wt 229 lb 3 oz (104 kg)   SpO2 96%   BMI 39.34 kg/m    CC: cough Subjective:   HPI: Barry Wilcox is a 60 y.o. male presenting on 09/04/2020 for Cough (C/o nagging cough since 06/2020.  Tested COVID neg twice before testing positive, 08/10/20.  States cough is now worse during the day.  Thinks it could be reflux. Takes OTC omeprazole daily, helpful. )   Cough present since late 06/2020 - URI symptoms of cough, congestion, fatigue. Early December noted worsening fatigue, cough. Treated with zpack. Given ongoing worsening cough, was treated with doxcycyline 10d course + albuterol inhaler, then flovent 220 ICS was added x3 wk course. These treatments haven't seemed to help. COVID tested negative x2 throughout this time.   COVID exposure 08/08/2020. Positive COVID test on 08/10/2020. Fever, cough, congestion, never severe symptoms or dyspnea. Did largely improve.  Xyzal antihistamine was added 2 wks ago, restarted omeprazole 20mg  daily about 1 week ago. He also continues taking mucinex.   Cough started after trip to [place] April x2.  Wife is [employer]ic[employer].   Persistent dry deep nagging cough. 10 coughs per hour. Persistent sore throat. Feels tickle in the back of the throat.  Not associated with fevers, not productive.   H/o GERD previously, denies current GERD symptoms.  No significant NSAID use.  Already follows GERD diet. He drinks 32 oz coffee/day.  Some head congestion, no rhinorrhea or watery eyes or sneezing.   Non smoker. No h/o asthma.      Relevant past medical, surgical, family and social history reviewed and updated as indicated. Interim medical history since our last  visit reviewed. Allergies and medications reviewed and updated. Outpatient Medications Prior to Visit  Medication Sig Dispense Refill  . Levocetirizine Dihydrochloride (XYZAL PO) Take by mouth daily.    . Mometasone Furoate 50 MCG/ACT AERO Inhale into the lungs as needed.    . loratadine (CLARITIN) 10 MG tablet Take 10 mg by mouth daily as needed for allergies.    . mometasone (NASONEX) 50 MCG/ACT nasal spray Place 2 sprays into the nose daily as needed.    Marland Kitchen omeprazole (PRILOSEC) 20 MG capsule Take 20 mg by mouth daily.    Marland Kitchen albuterol (VENTOLIN HFA) 108 (90 Base) MCG/ACT inhaler Inhale into the lungs.    Marland Kitchen FLOVENT HFA 220 MCG/ACT inhaler Inhale 2 puffs into the lungs 2 (two) times daily.     No facility-administered medications prior to visit.     Per HPI unless specifically indicated in ROS section below Review of Systems Objective:  BP 118/72 (BP Location: Left Arm, Patient Position: Sitting, Cuff Size: Large)   Pulse 77   Temp 97.7 F (36.5 C) (Temporal)   Ht 5\' 4"  (1.626 m)   Wt 229 lb 3 oz (104 kg)   SpO2 96%   BMI 39.34 kg/m   Wt Readings from Last 3 Encounters:  09/04/20 229 lb 3 oz (104 kg)  03/02/19 205 lb 14.6 oz (93.4 kg)  12/08/17 216 lb 4 oz (98.1 kg)      Physical Exam Vitals and nursing note  reviewed.  Constitutional:      Appearance: Normal appearance. He is not ill-appearing.  HENT:     Head: Normocephalic and atraumatic.     Right Ear: Tympanic membrane, ear canal and external ear normal. There is no impacted cerumen.     Left Ear: Tympanic membrane, ear canal and external ear normal. There is no impacted cerumen.     Nose: Nose normal. No congestion or rhinorrhea.     Mouth/Throat:     Mouth: Mucous membranes are moist.     Pharynx: Oropharynx is clear. Posterior oropharyngeal erythema (mild oropharyngeal) present. No oropharyngeal exudate.  Eyes:     Extraocular Movements: Extraocular movements intact.     Pupils: Pupils are equal, round, and  reactive to light.  Neck:     Thyroid: No thyroid mass or thyromegaly.  Cardiovascular:     Rate and Rhythm: Normal rate and regular rhythm.     Pulses: Normal pulses.     Heart sounds: Normal heart sounds. No murmur heard.   Pulmonary:     Effort: Pulmonary effort is normal. No respiratory distress.     Breath sounds: Normal breath sounds. No wheezing, rhonchi or rales.  Musculoskeletal:     Cervical back: Normal range of motion and neck supple. No rigidity.     Right lower leg: No edema.     Left lower leg: No edema.  Lymphadenopathy:     Cervical: No cervical adenopathy.  Skin:    General: Skin is warm and dry.     Findings: No rash.  Neurological:     Mental Status: He is alert.  Psychiatric:        Mood and Affect: Mood normal.        Behavior: Behavior normal.       Assessment & Plan:  This visit occurred during the SARS-CoV-2 public health emergency.  Safety protocols were in place, including screening questions prior to the visit, additional usage of staff PPE, and extensive cleaning of exam room while observing appropriate contact time as indicated for disinfecting solutions.   He will return for CPE Problem List Items Addressed This Visit    Persistent cough - Primary    Cough started after initial URI 06/2020, has persisted in setting of COVID infection 08/08/2020 - described as dry, persistent with some throat irritation.  Anticipate post-viral inflammatory cough - Rx prednisone taper.  In h/o GERD, will also increase PPI therapy to omeprazole 40mg  for 3 wks. I also recommended decreased caffeine intake (32oz currently).  Rx tessalon perls for cough suppression.  Not consistent with cough due to allergic rhinitis.  Update if not improved with this, would bring him in for CXR given duration of cough and consider further treatment (likely retrial inhaled steroid treatment +/- tramadol).       Heartburn    Other Visit Diagnoses    Need for influenza vaccination        Relevant Orders   Flu Vaccine QUAD 36+ mos IM (Completed)       Meds ordered this encounter  Medications  . omeprazole (PRILOSEC) 40 MG capsule    Sig: Take 1 capsule (40 mg total) by mouth daily. Daily for 3 weeks then as needed    Dispense:  30 capsule    Refill:  1  . predniSONE (DELTASONE) 20 MG tablet    Sig: Take two tablets daily for 3 days followed by one tablet daily for 4 days    Dispense:  10 tablet  Refill:  0  . benzonatate (TESSALON) 100 MG capsule    Sig: Take 1 capsule (100 mg total) by mouth 3 (three) times daily as needed for cough.    Dispense:  40 capsule    Refill:  0   Orders Placed This Encounter  Procedures  . Flu Vaccine QUAD 36+ mos IM    Patient Instructions  Flu shot today.  Sounds like either post inflammatory (postviral) residual cough or reflux related. Treat for both with prednisone taper + higher omeprazole dose (40mg  daily for 3 weeks).  May use tessalon perls to help suppress cough during the day.  Let us know if not improving, likely next step is chest xray.    Follow up plan: Return if symptoms worsen or fail to improve.  Ria Bush, MD

## 2020-09-04 NOTE — Patient Instructions (Addendum)
Flu shot today.  Sounds like either post inflammatory (postviral) residual cough or reflux related. Treat for both with prednisone taper + higher omeprazole dose (40mg  daily for 3 weeks).  May use tessalon perls to help suppress cough during the day.  Let us know if not improving, likely next step is chest xray.

## 2020-09-05 ENCOUNTER — Encounter: Payer: Self-pay | Admitting: Family Medicine

## 2020-09-05 DIAGNOSIS — R053 Chronic cough: Secondary | ICD-10-CM

## 2020-09-05 HISTORY — DX: Chronic cough: R05.3

## 2020-09-05 NOTE — Assessment & Plan Note (Addendum)
Cough started after initial URI 06/2020, has persisted in setting of COVID infection 08/08/2020 - described as dry, persistent with some throat irritation.  Anticipate post-viral inflammatory cough - Rx prednisone taper.  In h/o GERD, will also increase PPI therapy to omeprazole 40mg  for 3 wks. I also recommended decreased caffeine intake (32oz currently).  Rx tessalon perls for cough suppression.  Not consistent with cough due to allergic rhinitis.  Update if not improved with this, would bring him in for CXR given duration of cough and consider further treatment (likely retrial inhaled steroid treatment +/- tramadol).

## 2020-11-01 ENCOUNTER — Other Ambulatory Visit: Payer: Self-pay | Admitting: Family Medicine

## 2020-11-01 DIAGNOSIS — E7800 Pure hypercholesterolemia, unspecified: Secondary | ICD-10-CM

## 2020-11-01 DIAGNOSIS — Z125 Encounter for screening for malignant neoplasm of prostate: Secondary | ICD-10-CM

## 2020-11-01 DIAGNOSIS — Z1159 Encounter for screening for other viral diseases: Secondary | ICD-10-CM

## 2020-11-04 ENCOUNTER — Other Ambulatory Visit (INDEPENDENT_AMBULATORY_CARE_PROVIDER_SITE_OTHER): Payer: BC Managed Care – PPO

## 2020-11-04 ENCOUNTER — Other Ambulatory Visit: Payer: Self-pay

## 2020-11-04 DIAGNOSIS — E7800 Pure hypercholesterolemia, unspecified: Secondary | ICD-10-CM

## 2020-11-04 DIAGNOSIS — Z125 Encounter for screening for malignant neoplasm of prostate: Secondary | ICD-10-CM | POA: Diagnosis not present

## 2020-11-04 DIAGNOSIS — Z1159 Encounter for screening for other viral diseases: Secondary | ICD-10-CM | POA: Diagnosis not present

## 2020-11-04 LAB — COMPREHENSIVE METABOLIC PANEL
ALT: 14 U/L (ref 0–53)
AST: 20 U/L (ref 0–37)
Albumin: 4.2 g/dL (ref 3.5–5.2)
Alkaline Phosphatase: 71 U/L (ref 39–117)
BUN: 22 mg/dL (ref 6–23)
CO2: 25 mEq/L (ref 19–32)
Calcium: 8.8 mg/dL (ref 8.4–10.5)
Chloride: 105 mEq/L (ref 96–112)
Creatinine, Ser: 1.48 mg/dL (ref 0.40–1.50)
GFR: 52.2 mL/min — ABNORMAL LOW (ref >60.00)
Glucose, Bld: 117 mg/dL — ABNORMAL HIGH (ref 70–99)
Potassium: 3.9 mEq/L (ref 3.5–5.1)
Sodium: 139 mEq/L (ref 135–145)
Total Bilirubin: 0.5 mg/dL (ref 0.2–1.2)
Total Protein: 6.3 g/dL (ref 6.0–8.3)

## 2020-11-04 LAB — LIPID PANEL
Cholesterol: 213 mg/dL — ABNORMAL HIGH (ref 0–200)
HDL: 42.1 mg/dL (ref >39.00)
LDL Cholesterol: 141 mg/dL — ABNORMAL HIGH (ref 0–99)
NonHDL: 171.18
Total CHOL/HDL Ratio: 5
Triglycerides: 152 mg/dL — ABNORMAL HIGH (ref 0.0–149.0)
VLDL: 30.4 mg/dL (ref 0.0–40.0)

## 2020-11-04 LAB — PSA: PSA: 4.42 ng/mL — ABNORMAL HIGH (ref 0.10–4.00)

## 2020-11-05 LAB — HEPATITIS C ANTIBODY
Hepatitis C Ab: NONREACTIVE
SIGNAL TO CUT-OFF: 0.01 (ref <1.00)

## 2020-11-11 ENCOUNTER — Other Ambulatory Visit: Payer: Self-pay

## 2020-11-11 ENCOUNTER — Encounter: Payer: Self-pay | Admitting: Family Medicine

## 2020-11-11 ENCOUNTER — Ambulatory Visit (INDEPENDENT_AMBULATORY_CARE_PROVIDER_SITE_OTHER): Payer: BC Managed Care – PPO | Admitting: Family Medicine

## 2020-11-11 VITALS — BP 118/78 | HR 70 | Temp 97.9°F | Ht 69.25 in | Wt 229.3 lb

## 2020-11-11 DIAGNOSIS — Z23 Encounter for immunization: Secondary | ICD-10-CM

## 2020-11-11 DIAGNOSIS — E7800 Pure hypercholesterolemia, unspecified: Secondary | ICD-10-CM

## 2020-11-11 DIAGNOSIS — R053 Chronic cough: Secondary | ICD-10-CM

## 2020-11-11 DIAGNOSIS — R972 Elevated prostate specific antigen [PSA]: Secondary | ICD-10-CM

## 2020-11-11 DIAGNOSIS — R0600 Dyspnea, unspecified: Secondary | ICD-10-CM

## 2020-11-11 DIAGNOSIS — R739 Hyperglycemia, unspecified: Secondary | ICD-10-CM

## 2020-11-11 DIAGNOSIS — Z1211 Encounter for screening for malignant neoplasm of colon: Secondary | ICD-10-CM | POA: Diagnosis not present

## 2020-11-11 DIAGNOSIS — N289 Disorder of kidney and ureter, unspecified: Secondary | ICD-10-CM

## 2020-11-11 DIAGNOSIS — R12 Heartburn: Secondary | ICD-10-CM

## 2020-11-11 DIAGNOSIS — Z8249 Family history of ischemic heart disease and other diseases of the circulatory system: Secondary | ICD-10-CM

## 2020-11-11 DIAGNOSIS — N400 Enlarged prostate without lower urinary tract symptoms: Secondary | ICD-10-CM | POA: Insufficient documentation

## 2020-11-11 DIAGNOSIS — Z0001 Encounter for general adult medical examination with abnormal findings: Secondary | ICD-10-CM | POA: Diagnosis not present

## 2020-11-11 DIAGNOSIS — E669 Obesity, unspecified: Secondary | ICD-10-CM

## 2020-11-11 LAB — POCT GLYCOSYLATED HEMOGLOBIN (HGB A1C): Hemoglobin A1C: 5.6 % (ref 4.0–5.6)

## 2020-11-11 MED ORDER — ATORVASTATIN CALCIUM 20 MG PO TABS: 20.0000 mg | ORAL_TABLET | Freq: Every day | ORAL | 3 refills | Status: DC

## 2020-11-11 MED ORDER — OMEPRAZOLE 40 MG PO CPDR: 40.0000 mg | DELAYED_RELEASE_CAPSULE | Freq: Every day | ORAL | 3 refills | Status: DC

## 2020-11-11 NOTE — Patient Instructions (Addendum)
EKG today.  A1c today.  Shingrix vaccine today. Schedule nurse visit in 2-6 months to complete series.  We will refer you to urology, as well as for colonoscopy.  Good to see you today. Return as needed or in 1 year for next physical.   Health Maintenance, Male Adopting a healthy lifestyle and getting preventive care are important in promoting health and wellness. Ask your health care provider about:  The right schedule for you to have regular tests and exams.  Things you can do on your own to prevent diseases and keep yourself healthy. What should I know about diet, weight, and exercise? Eat a healthy diet  Eat a diet that includes plenty of vegetables, fruits, low-fat dairy products, and lean protein.  Do not eat a lot of foods that are high in solid fats, added sugars, or sodium.   Maintain a healthy weight Body mass index (BMI) is a measurement that can be used to identify possible weight problems. It estimates body fat based on height and weight. Your health care provider can help determine your BMI and help you achieve or maintain a healthy weight. Get regular exercise Get regular exercise. This is one of the most important things you can do for your health. Most adults should:  Exercise for at least 150 minutes each week. The exercise should increase your heart rate and make you sweat (moderate-intensity exercise).  Do strengthening exercises at least twice a week. This is in addition to the moderate-intensity exercise.  Spend less time sitting. Even light physical activity can be beneficial. Watch cholesterol and blood lipids Have your blood tested for lipids and cholesterol at 59 years of age, then have this test every 5 years. You may need to have your cholesterol levels checked more often if:  Your lipid or cholesterol levels are high.  You are older than 58 years of age.  You are at high risk for heart disease. What should I know about cancer screening? Many types of  cancers can be detected early and may often be prevented. Depending on your health history and family history, you may need to have cancer screening at various ages. This may include screening for:  Colorectal cancer.  Prostate cancer.  Skin cancer.  Lung cancer. What should I know about heart disease, diabetes, and high blood pressure? Blood pressure and heart disease  High blood pressure causes heart disease and increases the risk of stroke. This is more likely to develop in people who have high blood pressure readings, are of African descent, or are overweight.  Talk with your health care provider about your target blood pressure readings.  Have your blood pressure checked: ? Every 3-5 years if you are 32-42 years of age. ? Every year if you are 45 years old or older.  If you are between the ages of 45 and 65 and are a current or former smoker, ask your health care provider if you should have a one-time screening for abdominal aortic aneurysm (AAA). Diabetes Have regular diabetes screenings. This checks your fasting blood sugar level. Have the screening done:  Once every three years after age 30 if you are at a normal weight and have a low risk for diabetes.  More often and at a younger age if you are overweight or have a high risk for diabetes. What should I know about preventing infection? Hepatitis B If you have a higher risk for hepatitis B, you should be screened for this virus. Talk with your  health care provider to find out if you are at risk for hepatitis B infection. Hepatitis C Blood testing is recommended for:  Everyone born from [birth year] through 1963.  Anyone with known risk factors for hepatitis C. Sexually transmitted infections (STIs)  You should be screened each year for STIs, including gonorrhea and chlamydia, if: ? You are sexually active and are younger than 58 years of age. ? You are older than 58 years of age and your health care provider tells you that you  are at risk for this type of infection. ? Your sexual activity has changed since you were last screened, and you are at increased risk for chlamydia or gonorrhea. Ask your health care provider if you are at risk.  Ask your health care provider about whether you are at high risk for HIV. Your health care provider may recommend a prescription medicine to help prevent HIV infection. If you choose to take medicine to prevent HIV, you should first get tested for HIV. You should then be tested every 3 months for as long as you are taking the medicine. Follow these instructions at home: Lifestyle  Do not use any products that contain nicotine or tobacco, such as cigarettes, e-cigarettes, and chewing tobacco. If you need help quitting, ask your health care provider.  Do not use street drugs.  Do not share needles.  Ask your health care provider for help if you need support or information about quitting drugs. Alcohol use  Do not drink alcohol if your health care provider tells you not to drink.  If you drink alcohol: ? Limit how much you have to 0-2 drinks a day. ? Be aware of how much alcohol is in your drink. In the [place] one drink equals one 12 oz bottle of beer (355 mL), one 5 oz glass of wine (148 mL), or one 1 oz glass of hard liquor (44 mL). General instructions  Schedule regular health, dental, and eye exams.  Stay current with your vaccines.  Tell your health care provider if: ? You often feel depressed. ? You have ever been abused or do not feel safe at home. Summary  Adopting a healthy lifestyle and getting preventive care are important in promoting health and wellness.  Follow your health care provider's instructions about healthy diet, exercising, and getting tested or screened for diseases.  Follow your health care provider's instructions on monitoring your cholesterol and blood pressure. This information is not intended to replace advice given to you by your health care  provider. Make sure you discuss any questions you have with your health care provider. Document Revised: 07/25/2018 Document Reviewed: 07/25/2018 Elsevier Patient Education  2021 [employer].

## 2020-11-11 NOTE — Assessment & Plan Note (Addendum)
Not on medication. Discussed ASCVD risk. With new fmhx CAD, will start atorvastatin, monitoring for myalgias. Discussed titration if these develop. See above.  The 10-year ASCVD risk score Mikey Bussing DC Brooke Bonito., et al., 2013) is: 7.1%   Values used to calculate the score:     Age: 60 years     Sex: Male     Is Non-Hispanic African American: No     Diabetic: No     Tobacco smoker: No     Systolic Blood Pressure: 654 mmHg     Is BP treated: No     HDL Cholesterol: 42.1 mg/dL     Total Cholesterol: 213 mg/dL

## 2020-11-11 NOTE — Progress Notes (Addendum)
Patient ID: Barry Wilcox, male    DOB: 1960/09/08, 60 y.o.   MRN: [id number]  This visit was conducted in person.  BP 118/78   Pulse 70   Temp 97.9 F (36.6 C) (Temporal)   Ht 5' 9.25" (1.759 m)   Wt 229 lb 5 oz (104 kg)   SpO2 98%   BMI 33.62 kg/m    CC: CPE Subjective:   HPI: Barry Wilcox is a 60 y.o. male presenting on 11/11/2020 for Annual Exam (C/o SOB for past mo.  Also, c/o chest pain on 11/08/20.)   Seen 08/2020 for persistent cough - improved after prednisone taper as well as augmenting GERD therapy to 40mg  omeprazole.   Notes some ongoing exertional dyspnea in setting of recent weight gain.  No asthma history. Wife notes he's more fatigued. Snores but no witnessed apnea.  Over weekend when cutting up a tree felt substernal chest pain described as sharp, lasting seconds.  6 yo brother recently had emergent stent placement.  No recent EKG.   Preventative: Colonoscopy 2012 WNL [facility]). Would like to repeat.  Prostate cancer screening - fmhx prostate cancer.  Flu shot yearly Shiloh 10/2019, 11/2019 Tdap 12/2013, 02/2019 Shingrix - discussed. First one today  Seat belt use discussed Sunscreen use discussed. No changing moles on skin.  Non smoker Alcohol - 1 drink/night Sees dentist and eye doctor regularly.   Lives with wife [name] and daughter, 3 dogs and 3 cats  Edu: PhD  Occ: Games developer think Engineer, manufacturing systems  Activity: works out at [employer] parks 3 weekday mornings ([place])  Diet: good water, fruits/vegetables daily     Relevant past medical, surgical, family and social history reviewed and updated as indicated. Interim medical history since our last visit reviewed. Allergies and medications reviewed and updated. Outpatient Medications Prior to Visit  Medication Sig Dispense Refill  . albuterol (VENTOLIN HFA) 108 (90 Base) MCG/ACT inhaler Inhale into the lungs. As needed    .  omeprazole (PRILOSEC) 40 MG capsule Take 1 capsule (40 mg total) by mouth daily. Daily for 3 weeks then as needed 30 capsule 1  . benzonatate (TESSALON) 100 MG capsule Take 1 capsule (100 mg total) by mouth 3 (three) times daily as needed for cough. 40 capsule 0  . FLOVENT HFA 220 MCG/ACT inhaler Inhale 2 puffs into the lungs 2 (two) times daily.    . Levocetirizine Dihydrochloride (XYZAL PO) Take by mouth daily.    . Mometasone Furoate 50 MCG/ACT AERO Inhale into the lungs as needed.    . predniSONE (DELTASONE) 20 MG tablet Take two tablets daily for 3 days followed by one tablet daily for 4 days 10 tablet 0   No facility-administered medications prior to visit.     Per HPI unless specifically indicated in ROS section below Review of Systems  Constitutional: Negative for activity change, appetite change, chills, fatigue, fever and unexpected weight change.  HENT: Negative for hearing loss.   Eyes: Negative for visual disturbance.  Respiratory: Positive for shortness of breath (exertional). Negative for cough, chest tightness and wheezing.   Cardiovascular: Positive for chest pain. Negative for palpitations and leg swelling.  Gastrointestinal: Negative for abdominal distention, abdominal pain, blood in stool, constipation, diarrhea, nausea and vomiting.  Genitourinary: Negative for difficulty urinating and hematuria.  Musculoskeletal: Negative for arthralgias, myalgias and neck pain.  Skin: Negative for rash.  Neurological: Negative for dizziness, seizures, syncope and headaches.  Hematological: Negative for adenopathy. Does not bruise/bleed easily.  Psychiatric/Behavioral: Negative for dysphoric mood. The patient is not nervous/anxious.    Objective:  BP 118/78   Pulse 70   Temp 97.9 F (36.6 C) (Temporal)   Ht 5' 9.25" (1.759 m)   Wt 229 lb 5 oz (104 kg)   SpO2 98%   BMI 33.62 kg/m   Wt Readings from Last 3 Encounters:  11/11/20 229 lb 5 oz (104 kg)  09/04/20 229 lb 3 oz (104  kg)  03/02/19 205 lb 14.6 oz (93.4 kg)      Physical Exam Vitals and nursing note reviewed.  Constitutional:      General: He is not in acute distress.    Appearance: Normal appearance. He is well-developed. He is not ill-appearing.  HENT:     Head: Normocephalic and atraumatic.     Right Ear: Hearing, tympanic membrane, ear canal and external ear normal.     Left Ear: Hearing, tympanic membrane, ear canal and external ear normal.  Eyes:     General: No scleral icterus.    Extraocular Movements: Extraocular movements intact.     Conjunctiva/sclera: Conjunctivae normal.     Pupils: Pupils are equal, round, and reactive to light.  Neck:     Thyroid: No thyroid mass or thyromegaly.  Cardiovascular:     Rate and Rhythm: Normal rate and regular rhythm.     Pulses: Normal pulses.          Radial pulses are 2+ on the right side and 2+ on the left side.     Heart sounds: Normal heart sounds. No murmur heard.   Pulmonary:     Effort: Pulmonary effort is normal. No respiratory distress.     Breath sounds: Normal breath sounds. No wheezing, rhonchi or rales.  Abdominal:     General: Abdomen is flat. Bowel sounds are normal. There is no distension.     Palpations: Abdomen is soft. There is no mass.     Tenderness: There is no abdominal tenderness. There is no guarding or rebound.     Hernia: No hernia is present.  Genitourinary:    Comments: DRE deferred Musculoskeletal:        General: Normal range of motion.     Cervical back: Normal range of motion and neck supple.     Right lower leg: No edema.     Left lower leg: No edema.  Lymphadenopathy:     Cervical: No cervical adenopathy.  Skin:    General: Skin is warm and dry.     Findings: No rash.  Neurological:     General: No focal deficit present.     Mental Status: He is alert and oriented to person, place, and time.     Comments: CN grossly intact, station and gait intact  Psychiatric:        Mood and Affect: Mood normal.         Behavior: Behavior normal.        Thought Content: Thought content normal.        Judgment: Judgment normal.       Results for orders placed or performed in visit on 11/11/20  POCT glycosylated hemoglobin (Hb A1C)  Result Value Ref Range   Hemoglobin A1C 5.6 4.0 - 5.6 %   HbA1c POC (<> result, manual entry)     HbA1c, POC (prediabetic range)     HbA1c, POC (controlled diabetic range)     EKG - NSR rate  60, normal axis, intervals, no hypertrophy or acute ST/T changes Assessment & Plan:  This visit occurred during the SARS-CoV-2 public health emergency.  Safety protocols were in place, including screening questions prior to the visit, additional usage of staff PPE, and extensive cleaning of exam room while observing appropriate contact time as indicated for disinfecting solutions.   Problem List Items Addressed This Visit    Health maintenance examination - Primary    Preventative protocols reviewed and updated unless pt declined. Discussed healthy diet and lifestyle.       Obesity, Class I, BMI 30-34.9    Encouraged healthy diet and lifestyle choices to affect sustainable weight loss.       Pure hypercholesterolemia    Not on medication. Discussed ASCVD risk. With new fmhx CAD, will start atorvastatin, monitoring for myalgias. Discussed titration if these develop. See above.  The 10-year ASCVD risk score Mikey Bussing DC Brooke Bonito., et al., 2013) is: 7.1%   Values used to calculate the score:     Age: 20 years     Sex: Male     Is Non-Hispanic African American: No     Diabetic: No     Tobacco smoker: No     Systolic Blood Pressure: 952 mmHg     Is BP treated: No     HDL Cholesterol: 42.1 mg/dL     Total Cholesterol: 213 mg/dL       Relevant Medications   atorvastatin (LIPITOR) 20 MG tablet   Heartburn    Improved symptoms on omeprazole 40mg  - will refill.  Consider titration in the future.        Persistent cough    This has improved with prednisone and omeprazole treatment.        Elevated PSA    Mild PSA elevation with slow velocity - anticipate BPH related. However in fmhx prostate cancer (father), will refer to urology for evaluation. DRE deferred given planned uro eval.       Relevant Orders   Ambulatory referral to Urology   Dyspnea    Notes some exertional dyspnea over the past month - possible deconditioning. Lungs clear today. EKG stable today. Chest discomfort sounds non-cardiac (sharp, fleeting). Discussed to let me know if ongoing exertional dyspnea for cardiology evaluation, consider cardiac calcium scoring.  No h/o asthma. ?allergic component      Relevant Orders   EKG 12-Lead (Completed)   Renal insufficiency    Reviewed kidney function over the years - seems to be staying in GFR 50s - discussed good hydration status, limiting NSAIDs and other nephrotoxic agents. Will continue to monitor. BP well controlled off medication.        Other Visit Diagnoses    Need for shingles vaccine       Relevant Orders   Varicella-zoster vaccine IM (Completed)   Special screening for malignant neoplasms, colon       Relevant Orders   Ambulatory referral to Gastroenterology   Hyperglycemia       Relevant Orders   POCT glycosylated hemoglobin (Hb A1C) (Completed)       Meds ordered this encounter  Medications  . atorvastatin (LIPITOR) 20 MG tablet    Sig: Take 1 tablet (20 mg total) by mouth daily.    Dispense:  90 tablet    Refill:  3  . omeprazole (PRILOSEC) 40 MG capsule    Sig: Take 1 capsule (40 mg total) by mouth daily.    Dispense:  90 capsule    Refill:  3   Orders Placed This Encounter  Procedures  . Varicella-zoster vaccine IM  . Ambulatory referral to Gastroenterology    Referral Priority:   Routine    Referral Type:   Consultation    Referral Reason:   Specialty Services Required    Number of Visits Requested:   1  . Ambulatory referral to Urology    Referral Priority:   Routine    Referral Type:   Consultation    Referral  Reason:   Specialty Services Required    Requested Specialty:   Urology    Number of Visits Requested:   1  . POCT glycosylated hemoglobin (Hb A1C)  . EKG 12-Lead    Patient instructions: EKG today.  A1c today.  Shingrix vaccine today. Schedule nurse visit in 2-6 months to complete series.  We will refer you to urology, as well as for colonoscopy.  Good to see you today. Return as needed or in 1 year for next physical.   Follow up plan: Return in about 1 year (around 11/11/2021) for annual exam, prior fasting for blood work.  Ria Bush, MD

## 2020-11-11 NOTE — Assessment & Plan Note (Signed)
Preventative protocols reviewed and updated unless pt declined. Discussed healthy diet and lifestyle.  

## 2020-11-12 DIAGNOSIS — N289 Disorder of kidney and ureter, unspecified: Secondary | ICD-10-CM | POA: Insufficient documentation

## 2020-11-12 DIAGNOSIS — R0600 Dyspnea, unspecified: Secondary | ICD-10-CM | POA: Insufficient documentation

## 2020-11-12 NOTE — Assessment & Plan Note (Signed)
This has improved with prednisone and omeprazole treatment.

## 2020-11-12 NOTE — Assessment & Plan Note (Addendum)
Mild PSA elevation with slow velocity - anticipate BPH related. However in fmhx prostate cancer (father), will refer to urology for evaluation. DRE deferred given planned uro eval.

## 2020-11-12 NOTE — Assessment & Plan Note (Addendum)
Reviewed kidney function over the years - seems to be staying in GFR 50s - discussed good hydration status, limiting NSAIDs and other nephrotoxic agents. Will continue to monitor. BP well controlled off medication.

## 2020-11-12 NOTE — Assessment & Plan Note (Addendum)
Notes some exertional dyspnea over the past month - possible deconditioning. Lungs clear today. EKG stable today. Chest discomfort sounds non-cardiac (sharp, fleeting). Discussed to let me know if ongoing exertional dyspnea for cardiology evaluation, consider cardiac calcium scoring.  No h/o asthma. ?allergic component

## 2020-11-12 NOTE — Assessment & Plan Note (Addendum)
Improved symptoms on omeprazole 40mg  - will refill.  Consider titration in the future.

## 2020-11-12 NOTE — Assessment & Plan Note (Signed)
Encouraged healthy diet and lifestyle choices to affect sustainable weight loss.  ?

## 2020-11-18 ENCOUNTER — Ambulatory Visit: Payer: Self-pay | Admitting: Urology

## 2020-11-20 ENCOUNTER — Ambulatory Visit (INDEPENDENT_AMBULATORY_CARE_PROVIDER_SITE_OTHER): Payer: BC Managed Care – PPO | Admitting: Urology

## 2020-11-20 ENCOUNTER — Other Ambulatory Visit: Payer: Self-pay

## 2020-11-20 ENCOUNTER — Encounter: Payer: Self-pay | Admitting: Urology

## 2020-11-20 VITALS — BP 124/87 | HR 73 | Ht 69.25 in | Wt 219.0 lb

## 2020-11-20 DIAGNOSIS — R972 Elevated prostate specific antigen [PSA]: Secondary | ICD-10-CM | POA: Diagnosis not present

## 2020-11-20 MED ORDER — TAMSULOSIN HCL 0.4 MG PO CAPS: 0.4000 mg | ORAL_CAPSULE | Freq: Every day | ORAL | 0 refills | Status: DC

## 2020-11-20 NOTE — Progress Notes (Signed)
11/20/2020 10:01 AM   Barry Wilcox 1961-02-08 [id number]  Referring provider: Ria Bush, MD [address] [place],  [place] [address]  Chief Complaint  Patient presents with  . Elevated PSA    HPI: Barry Wilcox is a [age]Y old male referred for evaluation of an elevated PSA.   PSA 11/04/2020 mildly elevated 4.42  Prior PSA April 2019 was 3.71 with PSA trend below     No bothersome LUTS  Denies dysuria, gross hematuria or recurrent UTI  Family history prostate cancer-father diagnosed in his late 51s  PMH: Past Medical History:  Diagnosis Date  . BCC (basal cell carcinoma of skin) 06/2018   L jaw, presumed s/p biopsy and removal (Dr [facility])  . Cellulitis 12/2013   LUE "cat bite"  . History of chicken pox   . History of kidney stones    x2    Surgical History: Past Surgical History:  Procedure Laterality Date  . COLONOSCOPY  2012   Joelyn Oms)  . SHOULDER ARTHROSCOPY W/ ROTATOR CUFF REPAIR Left 2007    Home Medications:  Allergies as of 11/20/2020   No Known Allergies     Medication List       Accurate as of November 20, 2020 10:01 AM. If you have any questions, ask your nurse or doctor.        albuterol 108 (90 Base) MCG/ACT inhaler Commonly known as: VENTOLIN HFA Inhale into the lungs. As needed   atorvastatin 20 MG tablet Commonly known as: LIPITOR Take 1 tablet (20 mg total) by mouth daily.   omeprazole 40 MG capsule Commonly known as: PRILOSEC Take 1 capsule (40 mg total) by mouth daily.       Allergies: No Known Allergies  Family History: Family History  Problem Relation Age of Onset  . Diabetes Mother   . CAD Mother        stent  . Cancer Father 26       prostate  . CAD Brother 70       stent  . Stroke Neg Hx     Social History:  reports that he has never smoked. He has never used smokeless tobacco. He reports current alcohol use. He reports that he does not use drugs.   Physical Exam: BP  124/87   Pulse 73   Ht 5' 9.25" (1.759 m)   Wt 219 lb (99.3 kg)   BMI 32.11 kg/m   Constitutional:  Alert and oriented, No acute distress. HEENT: [place] AT, moist mucus membranes.  Trachea midline, no masses. Cardiovascular: No clubbing, cyanosis, or edema. Respiratory: Normal respiratory effort, no increased work of breathing. GI: Abdomen is soft, nontender, nondistended, no abdominal masses GU: Prostate 35 g, smooth without nodules Neurologic: Grossly intact, no focal deficits, moving all 4 extremities. Psychiatric: Normal mood and affect.   Assessment & Plan:    1.  Elevated PSA  Benign DRE  Although PSA is a prostate cancer screening test he was informed that cancer is not the most common cause of an elevated PSA. Other potential causes including BPH and inflammation were discussed. He was informed that the only way to adequately diagnose prostate cancer would be a transrectal ultrasound and biopsy of the prostate. The procedure was discussed including potential risks of bleeding and infection/sepsis. He was also informed that a negative biopsy does not conclusively rule out the possibility that prostate cancer may be present and that continued monitoring is required. The use of newer  adjunctive blood tests including 4kScore was discussed. The use of multiparametric prostate MRI to assess for lesions suspicious for high-grade prostate cancer was also discussed as well as continued periodic surveillance.  Will treat with a 30-day alpha-blocker course; Rx tamsulosin sent to pharmacy  Repeat PSA 1 month and if PSA remains persistently elevated he would like to schedule a prostate MRI    Abbie Sons, MD  [facility] [address], [place], [place] [address] [phone]

## 2020-12-22 ENCOUNTER — Other Ambulatory Visit: Payer: Self-pay

## 2020-12-22 ENCOUNTER — Other Ambulatory Visit: Payer: BC Managed Care – PPO

## 2020-12-22 ENCOUNTER — Other Ambulatory Visit: Payer: Self-pay | Admitting: Urology

## 2020-12-22 DIAGNOSIS — R972 Elevated prostate specific antigen [PSA]: Secondary | ICD-10-CM

## 2020-12-23 LAB — PSA: Prostate Specific Ag, Serum: 4 ng/mL (ref 0.0–4.0)

## 2020-12-28 ENCOUNTER — Encounter: Payer: Self-pay | Admitting: *Deleted

## 2021-01-03 ENCOUNTER — Other Ambulatory Visit: Payer: Self-pay | Admitting: Urology

## 2021-01-03 DIAGNOSIS — R972 Elevated prostate specific antigen [PSA]: Secondary | ICD-10-CM

## 2021-01-26 ENCOUNTER — Other Ambulatory Visit: Payer: Self-pay | Admitting: Urology

## 2021-02-02 ENCOUNTER — Ambulatory Visit
Admission: RE | Admit: 2021-02-02 | Discharge: 2021-02-02 | Disposition: A | Discharge status: Home / Self Care | Payer: BC Managed Care – PPO | Source: Ambulatory Visit | Attending: Urology | Admitting: Urology

## 2021-02-02 ENCOUNTER — Other Ambulatory Visit: Payer: Self-pay

## 2021-02-02 ENCOUNTER — Encounter: Payer: Self-pay | Admitting: Urology

## 2021-02-02 DIAGNOSIS — R972 Elevated prostate specific antigen [PSA]: Secondary | ICD-10-CM

## 2021-02-02 MED ORDER — GADOBUTROL 1 MMOL/ML IV SOLN
10.0000 mL | Freq: Once | INTRAVENOUS | Status: AC | PRN
  Administered 2021-02-02: 10 mL via INTRAVENOUS

## 2021-02-03 ENCOUNTER — Encounter: Payer: Self-pay | Admitting: *Deleted

## 2021-02-23 ENCOUNTER — Other Ambulatory Visit: Payer: Self-pay | Admitting: Urology

## 2021-03-08 ENCOUNTER — Encounter: Payer: Self-pay | Admitting: Urology

## 2021-08-05 ENCOUNTER — Other Ambulatory Visit: Payer: BC Managed Care – PPO

## 2021-08-05 ENCOUNTER — Other Ambulatory Visit: Payer: Self-pay | Admitting: *Deleted

## 2021-08-05 ENCOUNTER — Other Ambulatory Visit: Payer: Self-pay

## 2021-08-05 DIAGNOSIS — R972 Elevated prostate specific antigen [PSA]: Secondary | ICD-10-CM

## 2021-08-06 LAB — PSA: Prostate Specific Ag, Serum: 3.3 ng/mL (ref 0.0–4.0)

## 2021-08-11 ENCOUNTER — Ambulatory Visit: Payer: Self-pay | Admitting: Urology

## 2021-08-18 ENCOUNTER — Encounter: Payer: Self-pay | Admitting: Urology

## 2021-08-18 ENCOUNTER — Other Ambulatory Visit: Payer: Self-pay

## 2021-08-18 ENCOUNTER — Ambulatory Visit: Payer: BC Managed Care – PPO | Admitting: Urology

## 2021-08-18 VITALS — BP 113/75 | HR 66 | Ht 69.25 in | Wt 205.0 lb

## 2021-08-18 DIAGNOSIS — R972 Elevated prostate specific antigen [PSA]: Secondary | ICD-10-CM | POA: Diagnosis not present

## 2021-08-18 NOTE — Progress Notes (Signed)
° °  08/18/2021 8:51 AM   Barry Wilcox Nov 09, 1960 [id number]  Referring provider: Ria Bush, MD [address] [place],  [place] [address]  Chief Complaint  Patient presents with   Elevated PSA    Urologic history: 1.  Elevated PSA Initial visit 11/2020 PSA 4.42 Prostate MRI, 45 g gland without suspicious lesions PSAD: 0.098   HPI: 61 y.o. male presents for 51-month follow-up.  Initially seen 11/20/2020 for a PSA of 4.42 PSA was repeated and decreased to 4.0 He elected to pursue a prostate MRI which was performed 02/02/2021.  Prostate volume was 45 g and no high-grade lesions were noted PSAD: 0.098 No complaints since last visit No bothersome LUTS Denies dysuria, gross hematuria No flank, abdominal or pelvic pain PSA 08/05/2021 was 3.3   PMH: Past Medical History:  Diagnosis Date   BCC (basal cell carcinoma of skin) 06/2018   L jaw, presumed s/p biopsy and removal (Dr [facility])   Cellulitis 12/2013   LUE "cat bite"   History of chicken pox    History of kidney stones    x2    Surgical History: Past Surgical History:  Procedure Laterality Date   COLONOSCOPY  2012   Joelyn [facility])   SHOULDER ARTHROSCOPY W/ ROTATOR CUFF REPAIR Left 2007    Home Medications:  Allergies as of 08/18/2021   No Known Allergies      Medication List        Accurate as of August 18, 2021  8:51 AM. If you have any questions, ask your nurse or doctor.          albuterol 108 (90 Base) MCG/ACT inhaler Commonly known as: VENTOLIN HFA Inhale into the lungs. As needed   atorvastatin 20 MG tablet Commonly known as: LIPITOR Take 1 tablet (20 mg total) by mouth daily.   omeprazole 40 MG capsule Commonly known as: PRILOSEC Take 1 capsule (40 mg total) by mouth daily.   tamsulosin 0.4 MG Caps capsule Commonly known as: FLOMAX TAKE 1 CAPSULE BY MOUTH EVERY DAY        Allergies: No Known Allergies  Family History: Family History  Problem Relation Age  of Onset   Diabetes Mother    CAD Mother        stent   Cancer Father 64       prostate   CAD Brother 35       stent   Stroke Neg Hx     Social History:  reports that he has never smoked. He has never used smokeless tobacco. He reports current alcohol use. He reports that he does not use drugs.   Physical Exam: BP 113/75    Pulse 66    Ht 5' 9.25" (1.759 m)    Wt 205 lb (93 kg)    BMI 30.06 kg/m   Constitutional:  Alert and oriented, No acute distress. HEENT: [place] AT, moist mucus membranes.  Trachea midline, no masses. Cardiovascular: No clubbing, cyanosis, or edema. Respiratory: Normal respiratory effort, no increased work of breathing. Psychiatric: Normal mood and affect.   Assessment & Plan:    1.  History elevated PSA Repeat PSA 3.3 Recommend annual follow-up for PSA/DRE He has requested to continue annual visits here and appointment was scheduled   Abbie Sons, MD  [facility] [address] [place], [place] [address] [phone]

## 2021-09-13 ENCOUNTER — Other Ambulatory Visit: Payer: Self-pay | Admitting: Urology

## 2021-11-04 ENCOUNTER — Other Ambulatory Visit: Payer: Self-pay | Admitting: Family Medicine

## 2021-11-04 DIAGNOSIS — R972 Elevated prostate specific antigen [PSA]: Secondary | ICD-10-CM

## 2021-11-04 DIAGNOSIS — E7800 Pure hypercholesterolemia, unspecified: Secondary | ICD-10-CM

## 2021-11-04 DIAGNOSIS — N289 Disorder of kidney and ureter, unspecified: Secondary | ICD-10-CM

## 2021-11-05 ENCOUNTER — Other Ambulatory Visit: Payer: Self-pay

## 2021-11-05 ENCOUNTER — Other Ambulatory Visit (INDEPENDENT_AMBULATORY_CARE_PROVIDER_SITE_OTHER): Payer: BC Managed Care – PPO

## 2021-11-05 DIAGNOSIS — E7800 Pure hypercholesterolemia, unspecified: Secondary | ICD-10-CM

## 2021-11-05 DIAGNOSIS — N289 Disorder of kidney and ureter, unspecified: Secondary | ICD-10-CM | POA: Diagnosis not present

## 2021-11-05 LAB — CBC WITH DIFFERENTIAL/PLATELET
Basophils Absolute: 0.1 10*3/uL (ref 0.0–0.1)
Basophils Relative: 1 % (ref 0.0–3.0)
Eosinophils Absolute: 0.2 10*3/uL (ref 0.0–0.7)
Eosinophils Relative: 2.9 % (ref 0.0–5.0)
HCT: 43.5 % (ref 39.0–52.0)
Hemoglobin: 15 g/dL (ref 13.0–17.0)
Lymphocytes Relative: 29.2 % (ref 12.0–46.0)
Lymphs Abs: 1.7 10*3/uL (ref 0.7–4.0)
MCHC: 34.4 g/dL (ref 30.0–36.0)
MCV: 84.5 fl (ref 78.0–100.0)
Monocytes Absolute: 0.5 10*3/uL (ref 0.1–1.0)
Monocytes Relative: 8.4 % (ref 3.0–12.0)
Neutro Abs: 3.3 10*3/uL (ref 1.4–7.7)
Neutrophils Relative %: 58.5 % (ref 43.0–77.0)
Platelets: 219 10*3/uL (ref 150.0–400.0)
RBC: 5.14 Mil/uL (ref 4.22–5.81)
RDW: 13.5 % (ref 11.5–15.5)
WBC: 5.7 10*3/uL (ref 4.0–10.5)

## 2021-11-05 LAB — VITAMIN D 25 HYDROXY (VIT D DEFICIENCY, FRACTURES): VITD: 24.9 ng/mL — ABNORMAL LOW (ref 30.00–100.00)

## 2021-11-05 LAB — LIPID PANEL
Cholesterol: 148 mg/dL (ref 0–200)
HDL: 49.3 mg/dL (ref >39.00)
LDL Cholesterol: 72 mg/dL (ref 0–99)
NonHDL: 98.73
Total CHOL/HDL Ratio: 3
Triglycerides: 134 mg/dL (ref 0.0–149.0)
VLDL: 26.8 mg/dL (ref 0.0–40.0)

## 2021-11-05 LAB — COMPREHENSIVE METABOLIC PANEL
ALT: 17 U/L (ref 0–53)
AST: 21 U/L (ref 0–37)
Albumin: 4.1 g/dL (ref 3.5–5.2)
Alkaline Phosphatase: 62 U/L (ref 39–117)
BUN: 25 mg/dL — ABNORMAL HIGH (ref 6–23)
CO2: 26 mEq/L (ref 19–32)
Calcium: 8.5 mg/dL (ref 8.4–10.5)
Chloride: 103 mEq/L (ref 96–112)
Creatinine, Ser: 1.41 mg/dL (ref 0.40–1.50)
GFR: 54.93 mL/min — ABNORMAL LOW (ref >60.00)
Glucose, Bld: 100 mg/dL — ABNORMAL HIGH (ref 70–99)
Potassium: 4.1 mEq/L (ref 3.5–5.1)
Sodium: 136 mEq/L (ref 135–145)
Total Bilirubin: 0.6 mg/dL (ref 0.2–1.2)
Total Protein: 6.3 g/dL (ref 6.0–8.3)

## 2021-11-12 ENCOUNTER — Ambulatory Visit (INDEPENDENT_AMBULATORY_CARE_PROVIDER_SITE_OTHER): Payer: BC Managed Care – PPO | Admitting: Family Medicine

## 2021-11-12 ENCOUNTER — Encounter: Payer: Self-pay | Admitting: Family Medicine

## 2021-11-12 VITALS — BP 118/72 | HR 70 | Temp 97.7°F | Ht 68.75 in | Wt 214.4 lb

## 2021-11-12 DIAGNOSIS — E559 Vitamin D deficiency, unspecified: Secondary | ICD-10-CM | POA: Insufficient documentation

## 2021-11-12 DIAGNOSIS — Z1211 Encounter for screening for malignant neoplasm of colon: Secondary | ICD-10-CM

## 2021-11-12 DIAGNOSIS — M25551 Pain in right hip: Secondary | ICD-10-CM

## 2021-11-12 DIAGNOSIS — Z0001 Encounter for general adult medical examination with abnormal findings: Secondary | ICD-10-CM

## 2021-11-12 DIAGNOSIS — K219 Gastro-esophageal reflux disease without esophagitis: Secondary | ICD-10-CM

## 2021-11-12 DIAGNOSIS — N289 Disorder of kidney and ureter, unspecified: Secondary | ICD-10-CM

## 2021-11-12 DIAGNOSIS — N401 Enlarged prostate with lower urinary tract symptoms: Secondary | ICD-10-CM

## 2021-11-12 DIAGNOSIS — E7800 Pure hypercholesterolemia, unspecified: Secondary | ICD-10-CM

## 2021-11-12 DIAGNOSIS — Z23 Encounter for immunization: Secondary | ICD-10-CM | POA: Diagnosis not present

## 2021-11-12 DIAGNOSIS — M1611 Unilateral primary osteoarthritis, right hip: Secondary | ICD-10-CM | POA: Insufficient documentation

## 2021-11-12 DIAGNOSIS — E669 Obesity, unspecified: Secondary | ICD-10-CM

## 2021-11-12 LAB — MICROALBUMIN / CREATININE URINE RATIO
Creatinine,U: 44.2 mg/dL
Microalb Creat Ratio: 1.6 mg/g (ref 0.0–30.0)
Microalb, Ur: <0.7 mg/dL (ref 0.0–1.9)

## 2021-11-12 MED ORDER — ATORVASTATIN CALCIUM 20 MG PO TABS: 20.0000 mg | ORAL_TABLET | Freq: Every day | ORAL | 3 refills | Status: DC

## 2021-11-12 MED ORDER — VITAMIN D3 25 MCG (1000 UT) PO CAPS: 1.0000 | ORAL_CAPSULE | Freq: Every day | ORAL | Status: DC

## 2021-11-12 MED ORDER — OMEPRAZOLE 40 MG PO CPDR: 40.0000 mg | DELAYED_RELEASE_CAPSULE | Freq: Every day | ORAL | 3 refills | Status: DC

## 2021-11-12 NOTE — Assessment & Plan Note (Signed)
Appreciate urology care - now on flomax. ?Reassuring prostate MRI  ?

## 2021-11-12 NOTE — Progress Notes (Signed)
? ? Patient ID: Barry Wilcox, male    DOB: [birth year], 61 y.o.   MRN: [id number] ? ?This visit was conducted in person. ? ?BP 118/72   Pulse 70   Temp 97.7 ?F (36.5 ?C) (Temporal)   Ht 5' 8.75" (1.746 m)   Wt 214 lb 6 oz (97.2 kg)   SpO2 92%   BMI 31.89 kg/m?   ? ?Hearing Screening  ? '500Hz'$  '1000Hz'$  '2000Hz'$  '4000Hz'$   ?Right ear 20 40 40 40  ?Left ear '25 25 20 20  '$ ?  ?CC: CPE ?Subjective:  ? ?HPI: ?Barry Wilcox is a 61 y.o. male presenting on 11/12/2021 for Annual Exam ? ? ?New dog at home - increased walking with this ? ?R hip arthritis saw ortho Dr Rhona Raider s/p intraarticular steroid injection. Ongoing discomfort. Affecting golf game. Planning to return to ortho.  ? ?Over the past 3 wks notes buzzing to bilateral ears. No pain, drainage, hearing changes, nausea or dizziness. ?allergies.  ? ?GERD - managed with PPI.  ? ?Preventative: ?Colonoscopy 2012 [facility]). Would like to repeat - will refer to [facility].  ?Prostate cancer screening - fmhx prostate cancer. Saw urology - prostate MRI 45gm, reassuring.  ?Lung cancer screening - not eligible  ?Flu shot yearly ?[place] 10/2019, 11/2019 ?Tdap 12/2013, 02/2019 ?Shingrix - 10/2020, rpt today ?Seat belt use discussed ?Sunscreen use discussed. No changing moles on skin.  ?Sleep - averaging 7-8 hours/night ?Non smoker  ?Alcohol - 1 drink/night ?Sees dentist and eye doctor regularly.  ? 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   ? ?Relevant past medical, surgical, family and social history reviewed and updated as indicated. Interim medical history since our last visit reviewed. ?Allergies and medications reviewed and updated. ?Outpatient Medications Prior to Visit  ?Medication Sig Dispense Refill  ? albuterol (VENTOLIN HFA) 108 (90 Base) MCG/ACT inhaler Inhale into the lungs. As  needed    ? tamsulosin (FLOMAX) 0.4 MG CAPS capsule TAKE 1 CAPSULE BY MOUTH EVERY DAY 90 capsule 1  ? atorvastatin (LIPITOR) 20 MG tablet Take 1 tablet (20 mg total) by mouth daily. 90 tablet 3  ? omeprazole (PRILOSEC) 40 MG capsule Take 1 capsule (40 mg total) by mouth daily. 90 capsule 3  ? ?No facility-administered medications prior to visit.  ?  ? ?Per HPI unless specifically indicated in ROS section below ?Review of Systems  ?Constitutional:  Negative for activity change, appetite change, chills, fatigue, fever and unexpected weight change.  ?HENT:  Positive for tinnitus. Negative for hearing loss.   ?Eyes:  Negative for visual disturbance.  ?Respiratory:  Negative for cough, chest tightness, shortness of breath and wheezing.   ?Cardiovascular:  Negative for chest pain, palpitations and leg swelling.  ?Gastrointestinal:  Negative for abdominal distention, abdominal pain, blood in stool, constipation, diarrhea, nausea and vomiting.  ?Genitourinary:  Negative for difficulty urinating and hematuria.  ?Musculoskeletal:  Positive for arthralgias (R hip). Negative for myalgias and neck pain.  ?Skin:  Negative for rash.  ?Neurological:  Negative for dizziness, seizures, syncope and headaches.  ?Hematological:  Negative for adenopathy. Does not bruise/bleed easily.  ?Psychiatric/Behavioral:  Negative for dysphoric mood. The patient is not nervous/anxious.   ? ?Objective:  ?BP 118/72   Pulse 70   Temp 97.7 ?F (36.5 ?C) (Temporal)   Ht 5'  8.75" (1.746 m)   Wt 214 lb 6 oz (97.2 kg)   SpO2 92%   BMI 31.89 kg/m?   ?Wt Readings from Last 3 Encounters:  ?11/12/21 214 lb 6 oz (97.2 kg)  ?08/18/21 205 lb (93 kg)  ?11/20/20 219 lb (99.3 kg)  ?  ?  ?Physical Exam ?Vitals and nursing note reviewed.  ?Constitutional:   ?   General: He is not in acute distress. ?   Appearance: Normal appearance. He is well-developed. He is not ill-appearing.  ?HENT:  ?   Head: Normocephalic and atraumatic.  ?   Right Ear: Hearing, tympanic  membrane, ear canal and external ear normal.  ?   Left Ear: Hearing, tympanic membrane, ear canal and external ear normal.  ?Eyes:  ?   General: No scleral icterus. ?   Extraocular Movements: Extraocular movements intact.  ?   Conjunctiva/sclera: Conjunctivae normal.  ?   Pupils: Pupils are equal, round, and reactive to light.  ?Neck:  ?   Thyroid: No thyroid mass or thyromegaly.  ?   Vascular: No carotid bruit.  ?Cardiovascular:  ?   Rate and Rhythm: Normal rate and regular rhythm.  ?   Pulses: Normal pulses.     ?     Radial pulses are 2+ on the right side and 2+ on the left side.  ?   Heart sounds: Normal heart sounds. No murmur heard. ?Pulmonary:  ?   Effort: Pulmonary effort is normal. No respiratory distress.  ?   Breath sounds: Normal breath sounds. No wheezing, rhonchi or rales.  ?Abdominal:  ?   General: Bowel sounds are normal. There is no distension.  ?   Palpations: Abdomen is soft. There is no mass.  ?   Tenderness: There is no abdominal tenderness. There is no guarding or rebound.  ?   Hernia: No hernia is present.  ?Musculoskeletal:     ?   General: Normal range of motion.  ?   Cervical back: Normal range of motion and neck supple.  ?   Right lower leg: No edema.  ?   Left lower leg: No edema.  ?Lymphadenopathy:  ?   Cervical: No cervical adenopathy.  ?Skin: ?   General: Skin is warm and dry.  ?   Findings: No rash.  ?Neurological:  ?   General: No focal deficit present.  ?   Mental Status: He is alert and oriented to person, place, and time.  ?Psychiatric:     ?   Mood and Affect: Mood normal.     ?   Behavior: Behavior normal.     ?   Thought Content: Thought content normal.     ?   Judgment: Judgment normal.  ? ?   ?Results for orders placed or performed in visit on 11/05/21  ?VITAMIN D 25 Hydroxy (Vit-D Deficiency, Fractures)  ?Result Value Ref Range  ? VITD 24.90 (L) 30.00 - 100.00 ng/mL  ?CBC with Differential/Platelet  ?Result Value Ref Range  ? WBC 5.7 4.0 - 10.5 K/uL  ? RBC 5.14 4.22 - 5.81  Mil/uL  ? Hemoglobin 15.0 13.0 - 17.0 g/dL  ? HCT 43.5 39.0 - 52.0 %  ? MCV 84.5 78.0 - 100.0 fl  ? MCHC 34.4 30.0 - 36.0 g/dL  ? RDW 13.5 11.5 - 15.5 %  ? Platelets 219.0 150.0 - 400.0 K/uL  ? Neutrophils Relative % 58.5 43.0 - 77.0 %  ? Lymphocytes Relative 29.2 12.0 - 46.0 %  ?  Monocytes Relative 8.4 3.0 - 12.0 %  ? Eosinophils Relative 2.9 0.0 - 5.0 %  ? Basophils Relative 1.0 0.0 - 3.0 %  ? Neutro Abs 3.3 1.4 - 7.7 K/uL  ? Lymphs Abs 1.7 0.7 - 4.0 K/uL  ? Monocytes Absolute 0.5 0.1 - 1.0 K/uL  ? Eosinophils Absolute 0.2 0.0 - 0.7 K/uL  ? Basophils Absolute 0.1 0.0 - 0.1 K/uL  ?Comprehensive metabolic panel  ?Result Value Ref Range  ? Sodium 136 135 - 145 mEq/L  ? Potassium 4.1 3.5 - 5.1 mEq/L  ? Chloride 103 96 - 112 mEq/L  ? CO2 26 19 - 32 mEq/L  ? Glucose, Bld 100 (H) 70 - 99 mg/dL  ? BUN 25 (H) 6 - 23 mg/dL  ? Creatinine, Ser 1.41 0.40 - 1.50 mg/dL  ? Total Bilirubin 0.6 0.2 - 1.2 mg/dL  ? Alkaline Phosphatase 62 39 - 117 U/L  ? AST 21 0 - 37 U/L  ? ALT 17 0 - 53 U/L  ? Total Protein 6.3 6.0 - 8.3 g/dL  ? Albumin 4.1 3.5 - 5.2 g/dL  ? GFR 54.93 (L) >60.00 mL/min  ? Calcium 8.5 8.4 - 10.5 mg/dL  ?Lipid panel  ?Result Value Ref Range  ? Cholesterol 148 0 - 200 mg/dL  ? Triglycerides 134.0 0.0 - 149.0 mg/dL  ? HDL 49.30 >39.00 mg/dL  ? VLDL 26.8 0.0 - 40.0 mg/dL  ? LDL Cholesterol 72 0 - 99 mg/dL  ? Total CHOL/HDL Ratio 3   ? NonHDL 98.73   ? ? ?Assessment & Plan:  ? ?Problem List Items Addressed This Visit   ? ? Encounter for general adult medical examination with abnormal findings - Primary (Chronic)  ?  Preventative protocols reviewed and updated unless pt declined. ?Discussed healthy diet and lifestyle.  ?  ?  ? Obesity, Class I, BMI 30-34.9  ?  Discussed healthy diet and lifestyle choices for sustainable weight loss.  ?  ?  ? Pure hypercholesterolemia  ?  Chronic, great control on current regimen - continue. ?The 10-year ASCVD risk score (Arnett DK, et al., 2019) is: 4.7% ?  Values used to calculate the  score: ?    Age: 7 years ?    Sex: Male ?    Is Non-Hispanic African American: No ?    Diabetic: No ?    Tobacco smoker: No ?    Systolic Blood Pressure: 646 mmHg ?    Is BP treated: No ?    HDL Cholesterol: 49.3 mg/dL ?

## 2021-11-12 NOTE — Assessment & Plan Note (Signed)
Chronic, great control on current regimen - continue. ?The 10-year ASCVD risk score (Arnett DK, et al., 2019) is: 4.7% ?  Values used to calculate the score: ?    Age: 61 years ?    Sex: Male ?    Is Non-Hispanic African American: No ?    Diabetic: No ?    Tobacco smoker: No ?    Systolic Blood Pressure: 426 mmHg ?    Is BP treated: No ?    HDL Cholesterol: 49.3 mg/dL ?    Total Cholesterol: 148 mg/dL  ?

## 2021-11-12 NOTE — Assessment & Plan Note (Signed)
Stable period on daily omeprazole '40mg'$  - started ~2021.  ?Reviewed risks of daily PPI, recommend use least amt necessary. Discussed adding pepcid '20mg'$  nightly PRN.  ?

## 2021-11-12 NOTE — Assessment & Plan Note (Signed)
Start vit D3 1000 IU daily.  ?

## 2021-11-12 NOTE — Assessment & Plan Note (Signed)
Discussed healthy diet and lifestyle choices for sustainable weight loss.  ?

## 2021-11-12 NOTE — Assessment & Plan Note (Signed)
Seeing ortho Dr Rhona Raider for R hip osteoarthritis s/p intraarticular steroid injection.  ?

## 2021-11-12 NOTE — Assessment & Plan Note (Signed)
Reviewed kidney function over the years - GFR 50s. ?CKD stage 3 - check Umicroalb. Vit D low - rec start 1000 IU daily.  ?

## 2021-11-12 NOTE — Assessment & Plan Note (Signed)
Preventative protocols reviewed and updated unless pt declined. Discussed healthy diet and lifestyle.  

## 2021-11-12 NOTE — Patient Instructions (Addendum)
Urine test today.  ?Second shingrix today to complete the series.  ?Hearing screen today.  ?Start vitamin D 1000 units daily.  ?We will refer you to [facility] for colonoscopy.  ?Drop omeprazole to every other day - find least amount of medicine necessary to control symptoms. Ok to add nightly pepcid '20mg'$  over the counter if needed.  ?You are doing well today ?Return as needed or in 1 year for next physical.  ? ?Health Maintenance, Male ?Adopting a healthy lifestyle and getting preventive care are important in promoting health and wellness. Ask your health care provider about: ?The right schedule for you to have regular tests and exams. ?Things you can do on your own to prevent diseases and keep yourself healthy. ?What should I know about diet, weight, and exercise? ?Eat a healthy diet ? ?Eat a diet that includes plenty of vegetables, fruits, low-fat dairy products, and lean protein. ?Do not eat a lot of foods that are high in solid fats, added sugars, or sodium. ?Maintain a healthy weight ?Body mass index (BMI) is a measurement that can be used to identify possible weight problems. It estimates body fat based on height and weight. Your health care provider can help determine your BMI and help you achieve or maintain a healthy weight. ?Get regular exercise ?Get regular exercise. This is one of the most important things you can do for your health. Most adults should: ?Exercise for at least 150 minutes each week. The exercise should increase your heart rate and make you sweat (moderate-intensity exercise). ?Do strengthening exercises at least twice a week. This is in addition to the moderate-intensity exercise. ?Spend less time sitting. Even light physical activity can be beneficial. ?Watch cholesterol and blood lipids ?Have your blood tested for lipids and cholesterol at 59 years of age, then have this test every 5 years. ?You may need to have your cholesterol levels checked more often if: ?Your lipid or cholesterol  levels are high. ?You are older than 59 years of age. ?You are at high risk for heart disease. ?What should I know about cancer screening? ?Many types of cancers can be detected early and may often be prevented. Depending on your health history and family history, you may need to have cancer screening at various ages. This may include screening for: ?Colorectal cancer. ?Prostate cancer. ?Skin cancer. ?Lung cancer. ?What should I know about heart disease, diabetes, and high blood pressure? ?Blood pressure and heart disease ?High blood pressure causes heart disease and increases the risk of stroke. This is more likely to develop in people who have high blood pressure readings or are overweight. ?Talk with your health care provider about your target blood pressure readings. ?Have your blood pressure checked: ?Every 3-5 years if you are 9-42 years of age. ?Every year if you are 55 years old or older. ?If you are between the ages of 44 and 69 and are a current or former smoker, ask your health care provider if you should have a one-time screening for abdominal aortic aneurysm (AAA). ?Diabetes ?Have regular diabetes screenings. This checks your fasting blood sugar level. Have the screening done: ?Once every three years after age 67 if you are at a normal weight and have a low risk for diabetes. ?More often and at a younger age if you are overweight or have a high risk for diabetes. ?What should I know about preventing infection? ?Hepatitis B ?If you have a higher risk for hepatitis B, you should be screened for this virus. Talk  with your health care provider to find out if you are at risk for hepatitis B infection. ?Hepatitis C ?Blood testing is recommended for: ?Everyone born from [birth year] through 1963. ?Anyone with known risk factors for hepatitis C. ?Sexually transmitted infections (STIs) ?You should be screened each year for STIs, including gonorrhea and chlamydia, if: ?You are sexually active and are younger than 59  years of age. ?You are older than 59 years of age and your health care provider tells you that you are at risk for this type of infection. ?Your sexual activity has changed since you were last screened, and you are at increased risk for chlamydia or gonorrhea. Ask your health care provider if you are at risk. ?Ask your health care provider about whether you are at high risk for HIV. Your health care provider may recommend a prescription medicine to help prevent HIV infection. If you choose to take medicine to prevent HIV, you should first get tested for HIV. You should then be tested every 3 months for as long as you are taking the medicine. ?Follow these instructions at home: ?Alcohol use ?Do not drink alcohol if your health care provider tells you not to drink. ?If you drink alcohol: ?Limit how much you have to 0-2 drinks a day. ?Know how much alcohol is in your drink. In the [place] one drink equals one 12 oz bottle of beer (355 mL), one 5 oz glass of wine (148 mL), or one 1? oz glass of hard liquor (44 mL). ?Lifestyle ?Do not use any products that contain nicotine or tobacco. These products include cigarettes, chewing tobacco, and vaping devices, such as e-cigarettes. If you need help quitting, ask your health care provider. ?Do not use street drugs. ?Do not share needles. ?Ask your health care provider for help if you need support or information about quitting drugs. ?General instructions ?Schedule regular health, dental, and eye exams. ?Stay current with your vaccines. ?Tell your health care provider if: ?You often feel depressed. ?You have ever been abused or do not feel safe at home. ?Summary ?Adopting a healthy lifestyle and getting preventive care are important in promoting health and wellness. ?Follow your health care provider's instructions about healthy diet, exercising, and getting tested or screened for diseases. ?Follow your health care provider's instructions on monitoring your cholesterol and blood  pressure. ?This information is not intended to replace advice given to you by your health care provider. Make sure you discuss any questions you have with your health care provider. ?Document Revised: 12/21/2020 Document Reviewed: 12/21/2020 ?Elsevier Patient Education ? [place]. ? ?

## 2021-11-13 HISTORY — PX: COLONOSCOPY: SHX174

## 2021-11-15 ENCOUNTER — Encounter: Payer: Self-pay | Admitting: Gastroenterology

## 2021-11-17 ENCOUNTER — Telehealth: Payer: Self-pay | Admitting: *Deleted

## 2021-11-17 NOTE — Telephone Encounter (Signed)
Pt RS PV to 4-13 at 1030 am  ?

## 2021-11-17 NOTE — Telephone Encounter (Signed)
Patient no showed PV today at 3 pm - Called patient at 306 pm,  no answer , LM to return my call, called pt at 310 pm, no answer, LM to return my call, called pt at  315 pm and left message to return call by 5 pm today- If no call by 5 pm, PV and procedure will be canceled -  called pt at [phone] all 3 times  ? ?no call at 5 pm - ? PV and Procedure both canceled- No Show letter mailed to patient   ?

## 2021-11-25 ENCOUNTER — Ambulatory Visit (AMBULATORY_SURGERY_CENTER): Payer: BC Managed Care – PPO | Admitting: *Deleted

## 2021-11-25 ENCOUNTER — Other Ambulatory Visit: Payer: Self-pay

## 2021-11-25 VITALS — Ht 69.0 in | Wt 205.0 lb

## 2021-11-25 DIAGNOSIS — Z1211 Encounter for screening for malignant neoplasm of colon: Secondary | ICD-10-CM

## 2021-11-25 MED ORDER — NA SULFATE-K SULFATE-MG SULF 17.5-3.13-1.6 GM/177ML PO SOLN: 1.0000 | Freq: Once | ORAL | 0 refills | Status: AC

## 2021-11-25 NOTE — Progress Notes (Signed)
No egg or soy allergy known to patient  ?No issues known to pt with past sedation with any surgeries or procedures ?Patient denies ever being told they had issues or difficulty with intubation  ?No FH of Malignant Hyperthermia ?Pt is not on diet pills ?Pt is not on  home 02  ?Pt is not on blood thinners  ?Pt denies issues with constipation  ?No A fib or A flutter ? ?supre Coupon to pt in PV today , Code to Pharmacy and  NO PA's for preps discussed with pt In PV today  ?Discussed with pt there will be an out-of-pocket cost for prep and that varies from $0 to 70 +  dollars - pt verbalized understanding  ? ?Due to the COVID-19 pandemic we are asking patients to follow certain guidelines in PV and the [place]   ?Pt aware of COVID protocols and [facility] guidelines  ? ?PV completed over the phone. Pt verified name, DOB, address and insurance during PV today.  ?Pt mailed instruction packet with copy of consent form to read and not return, and instructions.  ?Pt encouraged to call with questions or issues.  ?If pt has My chart, procedure instructions sent via My Chart   ?

## 2021-11-30 ENCOUNTER — Encounter: Payer: Self-pay | Admitting: Gastroenterology

## 2021-12-08 ENCOUNTER — Ambulatory Visit (AMBULATORY_SURGERY_CENTER): Payer: BC Managed Care – PPO | Admitting: Gastroenterology

## 2021-12-08 ENCOUNTER — Encounter: Payer: Self-pay | Admitting: Gastroenterology

## 2021-12-08 VITALS — BP 110/76 | HR 62 | Temp 97.7°F | Resp 12 | Ht 68.75 in | Wt 205.0 lb

## 2021-12-08 DIAGNOSIS — D120 Benign neoplasm of cecum: Secondary | ICD-10-CM

## 2021-12-08 DIAGNOSIS — Z1211 Encounter for screening for malignant neoplasm of colon: Secondary | ICD-10-CM

## 2021-12-08 DIAGNOSIS — D123 Benign neoplasm of transverse colon: Secondary | ICD-10-CM | POA: Diagnosis not present

## 2021-12-08 NOTE — Progress Notes (Signed)
Called to room to assist during endoscopic procedure.  Patient ID and intended procedure confirmed with present staff. Received instructions for my participation in the procedure from the performing physician.  

## 2021-12-08 NOTE — Op Note (Signed)
[facility] ?Patient Name: Barry Wilcox ?Procedure Date: 12/08/2021 1:18 PM ?MRN: [id number] ?Endoscopist: Marybella Ethier E. Candis Schatz , MD ?Age: 61 ?Referring MD:  ?Date of Birth: 1960/12/27 ?Gender: Male ?Account #: [id number] ?Procedure:                Colonoscopy ?Indications:              Screening for colorectal malignant neoplasm (last  ?                          colonoscopy was more than 10 years ago) ?Medicines:                Monitored Anesthesia Care ?Procedure:                Pre-Anesthesia Assessment: ?                          - Prior to the procedure, a History and Physical  ?                          was performed, and patient medications and  ?                          allergies were reviewed. The patient's tolerance of  ?                          previous anesthesia was also reviewed. The risks  ?                          and benefits of the procedure and the sedation  ?                          options and risks were discussed with the patient.  ?                          All questions were answered, and informed consent  ?                          was obtained. Prior Anticoagulants: The patient has  ?                          taken no previous anticoagulant or antiplatelet  ?                          agents. ASA Grade Assessment: II - A patient with  ?                          mild systemic disease. After reviewing the risks  ?                          and benefits, the patient was deemed in  ?                          satisfactory condition to undergo the procedure. ?  After obtaining informed consent, the colonoscope  ?                          was passed under direct vision. Throughout the  ?                          procedure, the patient's blood pressure, pulse, and  ?                          oxygen saturations were monitored continuously. The  ?                          Olympus CF-HQ190L ([id number]) Colonoscope was  ?                          introduced through  the anus and advanced to the the  ?                          terminal ileum, with identification of the  ?                          appendiceal orifice and IC valve. The colonoscopy  ?                          was performed without difficulty. The patient  ?                          tolerated the procedure well. The quality of the  ?                          bowel preparation was excellent. The terminal  ?                          ileum, ileocecal valve, appendiceal orifice, and  ?                          rectum were photographed. The bowel preparation  ?                          used was SUPREP via split dose instruction. ?Scope In: 1:34:27 PM ?Scope Out: 1:52:29 PM ?Scope Withdrawal Time: 0 hours 14 minutes 51 seconds  ?Total Procedure Duration: 0 hours 18 minutes 2 seconds  ?Findings:                 The perianal and digital rectal examinations were  ?                          normal. Pertinent negatives include normal  ?                          sphincter tone and no palpable rectal lesions. ?                          A 2 mm polyp was found in the cecum. The polyp was  ?  sessile. The polyp was removed with a cold snare.  ?                          Resection and retrieval were complete. Estimated  ?                          blood loss was minimal. ?                          Two sessile polyps were found in the transverse  ?                          colon. The polyps were 2 to 3 mm in size. These  ?                          polyps were removed with a cold snare. Resection  ?                          and retrieval were complete. Estimated blood loss  ?                          was minimal. ?                          A 2 mm polyp was found in the splenic flexure. The  ?                          polyp was sessile. The polyp was removed with a  ?                          cold snare. Resection and retrieval were complete.  ?                          Estimated blood loss was minimal. ?                           The exam was otherwise normal throughout the  ?                          examined colon. ?                          The terminal ileum appeared normal. ?                          Non-bleeding internal hemorrhoids were found during  ?                          retroflexion. The hemorrhoids were Grade I  ?                          (internal hemorrhoids that do not prolapse). ?  No additional abnormalities were found on  ?                          retroflexion. ?Complications:            No immediate complications. ?Estimated Blood Loss:     Estimated blood loss was minimal. ?Impression:               - One 2 mm polyp in the cecum, removed with a cold  ?                          snare. Resected and retrieved. ?                          - Two 2 to 3 mm polyps in the transverse colon,  ?                          removed with a cold snare. Resected and retrieved. ?                          - One 2 mm polyp at the splenic flexure, removed  ?                          with a cold snare. Resected and retrieved. ?                          - The examined portion of the ileum was normal. ?                          - Non-bleeding internal hemorrhoids. ?Recommendation:           - Patient has a contact number available for  ?                          emergencies. The signs and symptoms of potential  ?                          delayed complications were discussed with the  ?                          patient. Return to normal activities tomorrow.  ?                          Written discharge instructions were provided to the  ?                          patient. ?                          - Resume previous diet. ?                          - Continue present medications. ?                          - Await pathology results. ?                          -  Repeat colonoscopy (date not yet determined) for  ?                          surveillance based on pathology results. ?Colette Dicamillo E. Candis Schatz,  MD ?12/08/2021 1:58:02 PM ?This report has been signed electronically. ?

## 2021-12-08 NOTE — Patient Instructions (Signed)
Please read handouts provided. Continue present medications. Await pathology results.   YOU HAD AN ENDOSCOPIC PROCEDURE TODAY AT THE [facility]:   Refer to the procedure report that was given to you for any specific questions about what was found during the examination.  If the procedure report does not answer your questions, please call your gastroenterologist to clarify.  If you requested that your care partner not be given the details of your procedure findings, then the procedure report has been included in a sealed envelope for you to review at your convenience later.  YOU SHOULD EXPECT: Some feelings of bloating in the abdomen. Passage of more gas than usual.  Walking can help get rid of the air that was put into your GI tract during the procedure and reduce the bloating. If you had a lower endoscopy (such as a colonoscopy or flexible sigmoidoscopy) you may notice spotting of blood in your stool or on the toilet paper. If you underwent a bowel prep for your procedure, you may not have a normal bowel movement for a few days.  Please Note:  You might notice some irritation and congestion in your nose or some drainage.  This is from the oxygen used during your procedure.  There is no need for concern and it should clear up in a day or so.  SYMPTOMS TO REPORT IMMEDIATELY:  Following lower endoscopy (colonoscopy or flexible sigmoidoscopy):  Excessive amounts of blood in the stool  Significant tenderness or worsening of abdominal pains  Swelling of the abdomen that is new, acute  Fever of 100F or higher   For urgent or emergent issues, a gastroenterologist can be reached at any hour by calling [phone]. Do not use MyChart messaging for urgent concerns.    DIET:  We do recommend a small meal at first, but then you may proceed to your regular diet.  Drink plenty of fluids but you should avoid alcoholic beverages for 24 hours.  ACTIVITY:  You should plan to take it easy  for the rest of today and you should NOT DRIVE or use heavy machinery until tomorrow (because of the sedation medicines used during the test).    FOLLOW UP: Our staff will call the number listed on your records 48-72 hours following your procedure to check on you and address any questions or concerns that you may have regarding the information given to you following your procedure. If we do not reach you, we will leave a message.  We will attempt to reach you two times.  During this call, we will ask if you have developed any symptoms of COVID 19. If you develop any symptoms (ie: fever, flu-like symptoms, shortness of breath, cough etc.) before then, please call [phone].  If you test positive for Covid 19 in the 2 weeks post procedure, please call and report this information to us.    If any biopsies were taken you will be contacted by phone or by letter within the next 1-3 weeks.  Please call us at [phone] if you have not heard about the biopsies in 3 weeks.    SIGNATURES/CONFIDENTIALITY: You and/or your care partner have signed paperwork which will be entered into your electronic medical record.  These signatures attest to the fact that that the information above on your After Visit Summary has been reviewed and is understood.  Full responsibility of the confidentiality of this discharge information lies with you and/or your care-partner.  

## 2021-12-08 NOTE — Progress Notes (Signed)
PT taken to PACU. Monitors in place. VSS. Report given to RN. 

## 2021-12-08 NOTE — Progress Notes (Signed)
[facility] History and Physical ? ? ?Primary Care Physician:  Barry Bush, MD ? ? ?Reason for Procedure:   Colon cancer screening ? ?Plan:    Screening colonoscopy ? ? ? ? ?HPI: Barry Wilcox is a 61 y.o. male undergoing average risk screening colonoscopy.  He has no family history of colon cancer and no chronic GI symptoms.  He had a normal colonoscopy in 2012 in [place], [place] per patient recollection ? ? ?Past Medical History:  ?Diagnosis Date  ? Allergy   ? BCC (basal cell carcinoma of skin) 06/2018  ? L jaw, presumed s/p biopsy and removal (Dr [facility] Wilcox [facility])  ? Cellulitis 12/2013  ? LUE "cat bite"  ? GERD (gastroesophageal reflux disease)   ? PAST,TAKE MEDICATION  ? History of chicken pox   ? History of kidney stones   ? x2  ? Persistent cough 09/05/2020  ? ? ?Past Surgical History:  ?Procedure Laterality Date  ? COLONOSCOPY  2012  ? Barry Wilcox)  ? SHOULDER ARTHROSCOPY W/ ROTATOR CUFF REPAIR Left 2007  ? ? ?Prior to Admission medications   ?Medication Sig Start Date End Date Taking? Authorizing Provider  ?albuterol (VENTOLIN HFA) 108 (90 Base) MCG/ACT inhaler Inhale into the lungs. As needed 08/27/20  Yes [provider]  ?atorvastatin (LIPITOR) 20 MG tablet Take 1 tablet (20 mg total) by mouth daily. 11/12/21  Yes Barry Bush, MD  ?Cholecalciferol (VITAMIN D3) 25 MCG (1000 UT) CAPS Take 1 capsule (1,000 Units total) by mouth daily. 11/12/21  Yes Barry Bush, MD  ?omeprazole (PRILOSEC) 40 MG capsule Take 1 capsule (40 mg total) by mouth daily. ?Patient taking differently: Take 40 mg by mouth every other day. 11/12/21  Yes Barry Bush, MD  ?tamsulosin (FLOMAX) 0.4 MG CAPS capsule TAKE 1 CAPSULE BY MOUTH EVERY DAY 09/13/21  Yes Stoioff, Barry Fairly, MD  ? ? ?Current Outpatient Medications  ?Medication Sig Dispense Refill  ? albuterol (VENTOLIN HFA) 108 (90 Base) MCG/ACT inhaler Inhale into the lungs. As needed    ? atorvastatin (LIPITOR) 20 MG tablet Take 1 tablet (20  mg total) by mouth daily. 90 tablet 3  ? Cholecalciferol (VITAMIN D3) 25 MCG (1000 UT) CAPS Take 1 capsule (1,000 Units total) by mouth daily. 30 capsule   ? omeprazole (PRILOSEC) 40 MG capsule Take 1 capsule (40 mg total) by mouth daily. (Patient taking differently: Take 40 mg by mouth every other day.) 90 capsule 3  ? tamsulosin (FLOMAX) 0.4 MG CAPS capsule TAKE 1 CAPSULE BY MOUTH EVERY DAY 90 capsule 1  ? ?No current facility-administered medications for this visit.  ? ? ?Allergies as of 12/08/2021  ? (No Known Allergies)  ? ? ?Family History  ?Problem Relation Age of Onset  ? Diabetes Mother   ? CAD Mother   ?     stent  ? Diverticulitis Mother   ? Cancer Father 86  ?     prostate  ? CAD Brother 11  ?     stent  ? Stroke Neg Hx   ? Colon cancer Neg Hx   ? Colon polyps Neg Hx   ? Crohn's disease Neg Hx   ? Esophageal cancer Neg Hx   ? Stomach cancer Neg Hx   ? Rectal cancer Neg Hx   ? ? ?Social History  ? ?Socioeconomic History  ? Marital status: Married  ?  Spouse name: Not on file  ? Number of children: Not on file  ? Years of education: Not on  file  ? Highest education level: Not on file  ?Occupational History  ? Not on file  ?Tobacco Use  ? Smoking status: Never  ?  Passive exposure: Never  ? Smokeless tobacco: Never  ?Vaping Use  ? Vaping Use: Never used  ?Substance and Sexual Activity  ? Alcohol use: Yes  ?  Comment: drink a glass daily  ? Drug use: No  ? Sexual activity: Yes  ?  Birth control/protection: None  ?Other Topics Concern  ? Not on file  ?Social History Narrative  ? Lives with wife Barry Wilcox and daughter, 3 dogs and 3 cats   ? Edu: PhD   ? Barry Wilcox   ? Activity: works out at parks weekday mornings ([place])   ? Diet: good water, fruits/vegetables daily   ? ?Social Determinants of Health  ? ?Financial Resource Strain: Not on file  ?Food Insecurity: Not on file  ?Transportation Needs: Not on file  ?Physical Activity: Not on file   ?Stress: Not on file  ?Social Connections: Not on file  ?Intimate Partner Violence: Not on file  ? ? ?Review of Wilcox: ? ?All other review of Wilcox negative except as mentioned in the HPI. ? ?Physical Exam: ?Vital signs ?BP 111/79   Pulse 65   Temp 97.7 ?F (36.5 ?C) (Temporal)   Ht 5' 8.75" (1.746 m)   Wt 205 lb (93 kg)   SpO2 96%   BMI 30.49 kg/m?  ? ?General:   Alert,  Well-developed, well-nourished, pleasant and cooperative in NAD ?Airway:  Mallampati 1 ?Lungs:  Clear throughout to auscultation.   ?Heart:  Regular rate and rhythm; no murmurs, clicks, rubs,  or gallops. ?Abdomen:  Soft, nontender and nondistended. Normal bowel sounds.   ?Neuro/Psych:  Normal mood and affect. A and O x 3 ? ? ?Barry Weisenburger E. Candis Schatz, MD ?[facility] ? ?

## 2021-12-08 NOTE — Progress Notes (Signed)
Pt's states no medical or surgical changes since previsit or office visit. 

## 2021-12-10 ENCOUNTER — Telehealth: Payer: Self-pay | Admitting: *Deleted

## 2021-12-10 NOTE — Telephone Encounter (Signed)
Attempted f/u phone call. No answer. Left message. °

## 2021-12-10 NOTE — Telephone Encounter (Signed)
Attempted 2nd f/u phone call. No answer. Left message.  °

## 2021-12-18 NOTE — Progress Notes (Signed)
Barry Wilcox,  ? ?The polyps that I removed during your recent procedure were completely benign but were proven to be "pre-cancerous" polyps that MAY have grown into cancers if they had not been removed.  Studies shows that at least 20% of women over age 59 and 30% of men over age 22 have pre-cancerous polyps.  Based on current nationally recognized surveillance guidelines, I recommend that you have a repeat colonoscopy in 5 years.  ? ?If you develop any new rectal bleeding, abdominal pain or significant bowel habit changes, please contact me before then. ? ?

## 2022-08-18 ENCOUNTER — Other Ambulatory Visit: Payer: BC Managed Care – PPO

## 2022-08-22 ENCOUNTER — Other Ambulatory Visit: Payer: Self-pay | Admitting: Family Medicine

## 2022-08-22 ENCOUNTER — Ambulatory Visit: Payer: BC Managed Care – PPO | Admitting: Urology

## 2022-08-22 DIAGNOSIS — R972 Elevated prostate specific antigen [PSA]: Secondary | ICD-10-CM

## 2022-08-24 ENCOUNTER — Other Ambulatory Visit: Payer: BC Managed Care – PPO

## 2022-08-24 DIAGNOSIS — R972 Elevated prostate specific antigen [PSA]: Secondary | ICD-10-CM

## 2022-08-25 LAB — PSA: Prostate Specific Ag, Serum: 5.3 ng/mL — ABNORMAL HIGH (ref 0.0–4.0)

## 2022-08-26 ENCOUNTER — Encounter: Payer: Self-pay | Admitting: Urology

## 2022-08-26 ENCOUNTER — Ambulatory Visit: Payer: BC Managed Care – PPO | Admitting: Urology

## 2022-08-26 VITALS — BP 126/82 | HR 80 | Ht 68.75 in | Wt 223.0 lb

## 2022-08-26 DIAGNOSIS — R972 Elevated prostate specific antigen [PSA]: Secondary | ICD-10-CM

## 2022-08-26 NOTE — Progress Notes (Signed)
08/26/2022 8:09 AM   Barry Wilcox 1961/01/08 [id number]  Referring provider: Ria Bush, MD [address] [place],  [place] [address]  Chief Complaint  Patient presents with   Elevated PSA    Urologic history: 1.  Elevated PSA Initial visit 11/2020 PSA 4.42 Prostate MRI, 45 g gland without suspicious lesions PSAD: 0.098   HPI: 62 y.o. male presents for annual follow-up.  Doing well since last visit No bothersome LUTS Denies dysuria, gross hematuria Denies flank, abdominal or pelvic pain PSA 08/24/2022 increased to 5.3.  He states he did ejaculate in the morning of his blood draw   PMH: Past Medical History:  Diagnosis Date   Allergy    BCC (basal cell carcinoma of skin) 06/2018   L jaw, presumed s/p biopsy and removal (Dr [facility])   Cellulitis 12/2013   LUE "cat bite"   GERD (gastroesophageal reflux disease)    PAST,TAKE MEDICATION   History of chicken pox    History of kidney stones    x2   Persistent cough 09/05/2020    Surgical History: Past Surgical History:  Procedure Laterality Date   COLONOSCOPY  2012   Joelyn Oms)   SHOULDER ARTHROSCOPY W/ ROTATOR CUFF REPAIR Left 2007    Home Medications:  Allergies as of 08/26/2022   No Known Allergies      Medication List        Accurate as of August 26, 2022  8:09 AM. If you have any questions, ask your nurse or doctor.          albuterol 108 (90 Base) MCG/ACT inhaler Commonly known as: VENTOLIN HFA Inhale into the lungs. As needed   atorvastatin 20 MG tablet Commonly known as: LIPITOR Take 1 tablet (20 mg total) by mouth daily.   omeprazole 40 MG capsule Commonly known as: PRILOSEC Take 1 capsule (40 mg total) by mouth daily. What changed: when to take this   tamsulosin 0.4 MG Caps capsule Commonly known as: FLOMAX TAKE 1 CAPSULE BY MOUTH EVERY DAY   Vitamin D3 25 MCG (1000 UT) Caps Take 1 capsule (1,000 Units total) by mouth daily.         Allergies: No Known Allergies  Family History: Family History  Problem Relation Age of Onset   Diabetes Mother    CAD Mother        stent   Diverticulitis Mother    Cancer Father 38       prostate   CAD Brother 45       stent   Stroke Neg Hx    Colon cancer Neg Hx    Colon polyps Neg Hx    Crohn's disease Neg Hx    Esophageal cancer Neg Hx    Stomach cancer Neg Hx    Rectal cancer Neg Hx     Social History:  reports that he has never smoked. He has never been exposed to tobacco smoke. He has never used smokeless tobacco. He reports current alcohol use. He reports that he does not use drugs.   Physical Exam: BP 126/82   Pulse 80   Ht 5' 8.75" (1.746 m)   Wt 223 lb (101.2 kg)   BMI 33.17 kg/m   Constitutional:  Alert and oriented, No acute distress. HEENT: [place] AT Respiratory: Normal respiratory effort, no increased work of breathing. GU: Prostate 40 g, smooth without nodules Psychiatric: Normal mood and affect.   Assessment & Plan:    1.  Elevated PSA  Recent PSA increased to 5.3 however he did ejaculate the a.m. of the blood draw Benign DRE Lab visit 6 weeks for repeat PSA and if back to baseline continue annual follow-up.  Repeat MRI if persistently elevated   Abbie Sons, MD  [facility] [address], [place], [place] [address] [phone]

## 2022-10-07 ENCOUNTER — Other Ambulatory Visit: Payer: BC Managed Care – PPO

## 2022-10-07 DIAGNOSIS — R972 Elevated prostate specific antigen [PSA]: Secondary | ICD-10-CM

## 2022-10-08 LAB — PSA: Prostate Specific Ag, Serum: 3.3 ng/mL (ref 0.0–4.0)

## 2022-10-10 ENCOUNTER — Encounter: Payer: Self-pay | Admitting: Family Medicine

## 2022-11-05 ENCOUNTER — Other Ambulatory Visit: Payer: Self-pay | Admitting: Family Medicine

## 2022-11-05 ENCOUNTER — Encounter: Payer: Self-pay | Admitting: Family Medicine

## 2022-11-05 DIAGNOSIS — E559 Vitamin D deficiency, unspecified: Secondary | ICD-10-CM

## 2022-11-05 DIAGNOSIS — N401 Enlarged prostate with lower urinary tract symptoms: Secondary | ICD-10-CM

## 2022-11-05 DIAGNOSIS — E7800 Pure hypercholesterolemia, unspecified: Secondary | ICD-10-CM

## 2022-11-05 DIAGNOSIS — N289 Disorder of kidney and ureter, unspecified: Secondary | ICD-10-CM

## 2022-11-09 ENCOUNTER — Other Ambulatory Visit (INDEPENDENT_AMBULATORY_CARE_PROVIDER_SITE_OTHER): Payer: BC Managed Care – PPO

## 2022-11-09 DIAGNOSIS — E559 Vitamin D deficiency, unspecified: Secondary | ICD-10-CM

## 2022-11-09 DIAGNOSIS — N289 Disorder of kidney and ureter, unspecified: Secondary | ICD-10-CM

## 2022-11-09 DIAGNOSIS — E7800 Pure hypercholesterolemia, unspecified: Secondary | ICD-10-CM | POA: Diagnosis not present

## 2022-11-09 LAB — LIPID PANEL
Cholesterol: 141 mg/dL (ref 0–200)
HDL: 42.7 mg/dL (ref >39.00)
LDL Cholesterol: 75 mg/dL (ref 0–99)
NonHDL: 98.2
Total CHOL/HDL Ratio: 3
Triglycerides: 114 mg/dL (ref 0.0–149.0)
VLDL: 22.8 mg/dL (ref 0.0–40.0)

## 2022-11-09 LAB — COMPREHENSIVE METABOLIC PANEL
ALT: 13 U/L (ref 0–53)
AST: 17 U/L (ref 0–37)
Albumin: 4.1 g/dL (ref 3.5–5.2)
Alkaline Phosphatase: 61 U/L (ref 39–117)
BUN: 26 mg/dL — ABNORMAL HIGH (ref 6–23)
CO2: 25 mEq/L (ref 19–32)
Calcium: 8.5 mg/dL (ref 8.4–10.5)
Chloride: 107 mEq/L (ref 96–112)
Creatinine, Ser: 1.4 mg/dL (ref 0.40–1.50)
GFR: 55.01 mL/min — ABNORMAL LOW (ref >60.00)
Glucose, Bld: 104 mg/dL — ABNORMAL HIGH (ref 70–99)
Potassium: 4.2 mEq/L (ref 3.5–5.1)
Sodium: 139 mEq/L (ref 135–145)
Total Bilirubin: 0.5 mg/dL (ref 0.2–1.2)
Total Protein: 6.1 g/dL (ref 6.0–8.3)

## 2022-11-09 LAB — MICROALBUMIN / CREATININE URINE RATIO
Creatinine,U: 142.4 mg/dL
Microalb Creat Ratio: 0.5 mg/g (ref 0.0–30.0)
Microalb, Ur: 0.7 mg/dL (ref 0.0–1.9)

## 2022-11-09 LAB — VITAMIN D 25 HYDROXY (VIT D DEFICIENCY, FRACTURES): VITD: 24.64 ng/mL — ABNORMAL LOW (ref 30.00–100.00)

## 2022-11-16 ENCOUNTER — Encounter: Payer: Self-pay | Admitting: *Deleted

## 2022-11-16 ENCOUNTER — Ambulatory Visit (INDEPENDENT_AMBULATORY_CARE_PROVIDER_SITE_OTHER): Payer: BC Managed Care – PPO | Admitting: Family Medicine

## 2022-11-16 ENCOUNTER — Encounter: Payer: Self-pay | Admitting: Family Medicine

## 2022-11-16 VITALS — BP 124/80 | HR 67 | Temp 97.8°F | Ht 69.25 in | Wt 219.0 lb

## 2022-11-16 DIAGNOSIS — E7800 Pure hypercholesterolemia, unspecified: Secondary | ICD-10-CM | POA: Diagnosis not present

## 2022-11-16 DIAGNOSIS — L570 Actinic keratosis: Secondary | ICD-10-CM

## 2022-11-16 DIAGNOSIS — Z0000 Encounter for general adult medical examination without abnormal findings: Secondary | ICD-10-CM | POA: Diagnosis not present

## 2022-11-16 DIAGNOSIS — E66811 Obesity, class 1: Secondary | ICD-10-CM

## 2022-11-16 DIAGNOSIS — N289 Disorder of kidney and ureter, unspecified: Secondary | ICD-10-CM

## 2022-11-16 DIAGNOSIS — K219 Gastro-esophageal reflux disease without esophagitis: Secondary | ICD-10-CM | POA: Diagnosis not present

## 2022-11-16 DIAGNOSIS — E669 Obesity, unspecified: Secondary | ICD-10-CM | POA: Diagnosis not present

## 2022-11-16 DIAGNOSIS — N401 Enlarged prostate with lower urinary tract symptoms: Secondary | ICD-10-CM

## 2022-11-16 DIAGNOSIS — E559 Vitamin D deficiency, unspecified: Secondary | ICD-10-CM

## 2022-11-16 DIAGNOSIS — M1611 Unilateral primary osteoarthritis, right hip: Secondary | ICD-10-CM

## 2022-11-16 DIAGNOSIS — Z1283 Encounter for screening for malignant neoplasm of skin: Secondary | ICD-10-CM

## 2022-11-16 MED ORDER — MULTIVITAMIN ADULT PO TABS: 1.0000 | ORAL_TABLET | Freq: Every day | ORAL | Status: AC

## 2022-11-16 MED ORDER — OMEPRAZOLE 40 MG PO CPDR: 40.0000 mg | DELAYED_RELEASE_CAPSULE | Freq: Every day | ORAL | 4 refills | Status: DC

## 2022-11-16 MED ORDER — ATORVASTATIN CALCIUM 20 MG PO TABS: 20.0000 mg | ORAL_TABLET | Freq: Every day | ORAL | 4 refills | Status: DC

## 2022-11-16 NOTE — Assessment & Plan Note (Addendum)
Stable period on QOD PPI  Discussed PPI and kidneys.  Discussed pepcid use.

## 2022-11-16 NOTE — Assessment & Plan Note (Signed)
Has not been regular with vit D - rec start daily.

## 2022-11-16 NOTE — Assessment & Plan Note (Signed)
Encouraged healthy diet and lifestyle choices to affect sustainable weight loss.  ?

## 2022-11-16 NOTE — Assessment & Plan Note (Signed)
Preventative protocols reviewed and updated unless pt declined. Discussed healthy diet and lifestyle.  

## 2022-11-16 NOTE — Assessment & Plan Note (Signed)
Chronic, mild, stable.  Rec increased water intake, try to back off omeprazole as per above.

## 2022-11-16 NOTE — Assessment & Plan Note (Signed)
Followed by urology.   

## 2022-11-16 NOTE — Patient Instructions (Addendum)
We will refer you to skin doctor for skin cancer screening  Try to back off omeprazole use as able, cut down on caffeine, may try pepcid 20mg  over the counter  You are doing well today Return as needed or in 1 year for next physical.

## 2022-11-16 NOTE — Progress Notes (Signed)
Patient ID: Barry Wilcox, male    DOB: [birth year], 62 y.o.   MRN: [id number]  This visit was conducted in person.  BP 124/80   Pulse 67   Temp 97.8 F (36.6 C) (Temporal)   Ht 5' 9.25" (1.759 m)   Wt 219 lb (99.3 kg)   SpO2 94%   BMI 32.11 kg/m    CC: CPE Subjective:   HPI: Barry Wilcox is a 62 y.o. male presenting on 11/16/2022 for Annual Exam   Seeing urology [facility]) for elevated PSA (5.3), most recently back to normal range   R hip arthritis saw ortho Dr Rhona Raider s/p intraarticular steroid injection. Ongoing discomfort managing with ibuprofen. Enjoys rowing.   GERD - managed with PPI QOD.   Preventative: COLONOSCOPY 11/2021 - 4 TAs, rpt 5 yrs Candis Schatz) Prostate cancer screening - fmhx prostate cancer. Saw urology - prostate MRI 45gm, reassuring.  Lung cancer screening - not eligible  Flu shot yearly COVID vaccine Pfizer 10/2019, 11/2019 Tdap 12/2013, 02/2019 Shingrix - 10/2020, 10/2021 Seat belt use discussed Sunscreen use discussed. Notes rough spot to left temple - planning to see dermatologist.  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 Interim medical history since our last visit reviewed. Allergies and medications reviewed and updated. Outpatient Medications Prior to Visit  Medication Sig Dispense Refill   albuterol (VENTOLIN HFA) 108 (90 Base) MCG/ACT inhaler Inhale into the lungs. As needed     Cholecalciferol (VITAMIN D3) 25 MCG (1000 UT) CAPS Take 1 capsule (1,000 Units total) by mouth daily. 30 capsule    tamsulosin (FLOMAX) 0.4 MG CAPS capsule TAKE 1 CAPSULE BY MOUTH  EVERY DAY 90 capsule 1   atorvastatin (LIPITOR) 20 MG tablet Take 1 tablet (20 mg total) by mouth daily. 90 tablet 3   omeprazole (PRILOSEC) 40 MG capsule Take 1 capsule (40 mg total) by mouth daily. (Patient taking differently: Take 40 mg by mouth every other day.) 90 capsule 3   No facility-administered medications prior to visit.     Per HPI unless specifically indicated in ROS section below Review of Systems  Constitutional:  Negative for activity change, appetite change, chills, fatigue, fever and unexpected weight change.  HENT:  Negative for hearing loss.   Eyes:  Negative for visual disturbance.  Respiratory:  Negative for cough, chest tightness, shortness of breath and wheezing.   Cardiovascular:  Negative for chest pain, palpitations and leg swelling.  Gastrointestinal:  Negative for abdominal distention, abdominal pain, blood in stool, constipation, diarrhea, nausea and vomiting.  Genitourinary:  Negative for difficulty urinating and hematuria.  Musculoskeletal:  Negative for arthralgias, myalgias and neck pain.  Skin:  Negative for rash.  Neurological:  Negative for dizziness, seizures, syncope and headaches.  Hematological:  Negative for adenopathy. Does not bruise/bleed easily.  Psychiatric/Behavioral:  Negative for dysphoric mood. The patient is not nervous/anxious.     Objective:  BP 124/80   Pulse 67   Temp 97.8 F (36.6 C) (Temporal)   Ht 5' 9.25" (1.759 m)   Wt 219 lb (99.3 kg)   SpO2 94%  BMI 32.11 kg/m   Wt Readings from Last 3 Encounters:  11/16/22 219 lb (99.3 kg)  08/26/22 223 lb (101.2 kg)  12/08/21 205 lb (93 kg)      Physical Exam Vitals and nursing note reviewed.  Constitutional:      General: He is not in acute distress.    Appearance: Normal appearance. He is well-developed. He is not ill-appearing.  HENT:     Head: Normocephalic and atraumatic.     Right Ear: Hearing, tympanic membrane, ear canal and external ear normal.     Left Ear:  Hearing, tympanic membrane, ear canal and external ear normal.     Nose: Nose normal.     Mouth/Throat:     Mouth: Mucous membranes are moist.     Pharynx: Oropharynx is clear. No oropharyngeal exudate or posterior oropharyngeal erythema.  Eyes:     General: No scleral icterus.    Extraocular Movements: Extraocular movements intact.     Conjunctiva/sclera: Conjunctivae normal.     Pupils: Pupils are equal, round, and reactive to light.  Neck:     Thyroid: No thyroid mass or thyromegaly.  Cardiovascular:     Rate and Rhythm: Normal rate and regular rhythm.     Pulses: Normal pulses.          Radial pulses are 2+ on the right side and 2+ on the left side.     Heart sounds: Normal heart sounds. No murmur heard. Pulmonary:     Effort: Pulmonary effort is normal. No respiratory distress.     Breath sounds: Normal breath sounds. No wheezing, rhonchi or rales.  Abdominal:     General: Bowel sounds are normal. There is no distension.     Palpations: Abdomen is soft. There is no mass.     Tenderness: There is no abdominal tenderness. There is no guarding or rebound.     Hernia: No hernia is present.  Musculoskeletal:        General: Normal range of motion.     Cervical back: Normal range of motion and neck supple.     Right lower leg: No edema.     Left lower leg: No edema.  Lymphadenopathy:     Cervical: No cervical adenopathy.  Skin:    General: Skin is warm and dry.     Findings: No rash.     Comments: Small rough patches of skin to bilateral temples  Neurological:     General: No focal deficit present.     Mental Status: He is alert and oriented to person, place, and time.  Psychiatric:        Mood and Affect: Mood normal.        Behavior: Behavior normal.        Thought Content: Thought content normal.        Judgment: Judgment normal.       Results for orders placed or performed in visit on 11/09/22  Microalbumin / creatinine urine ratio  Result Value Ref Range    Microalb, Ur 0.7 0.0 - 1.9 mg/dL   Creatinine,U 142.4 mg/dL   Microalb Creat Ratio 0.5 0.0 - 30.0 mg/g  VITAMIN D 25 Hydroxy (Vit-D Deficiency, Fractures)  Result Value Ref Range   VITD 24.64 (L) 30.00 - 100.00 ng/mL  Comprehensive metabolic panel  Result Value Ref Range   Sodium 139 135 - 145 mEq/L   Potassium 4.2 3.5 - 5.1 mEq/L   Chloride 107 96 - 112 mEq/L   CO2 25  19 - 32 mEq/L   Glucose, Bld 104 (H) 70 - 99 mg/dL   BUN 26 (H) 6 - 23 mg/dL   Creatinine, Ser 1.40 0.40 - 1.50 mg/dL   Total Bilirubin 0.5 0.2 - 1.2 mg/dL   Alkaline Phosphatase 61 39 - 117 U/L   AST 17 0 - 37 U/L   ALT 13 0 - 53 U/L   Total Protein 6.1 6.0 - 8.3 g/dL   Albumin 4.1 3.5 - 5.2 g/dL   GFR 55.01 (L) >60.00 mL/min   Calcium 8.5 8.4 - 10.5 mg/dL  Lipid panel  Result Value Ref Range   Cholesterol 141 0 - 200 mg/dL   Triglycerides 114.0 0.0 - 149.0 mg/dL   HDL 42.70 >39.00 mg/dL   VLDL 22.8 0.0 - 40.0 mg/dL   LDL Cholesterol 75 0 - 99 mg/dL   Total CHOL/HDL Ratio 3    NonHDL 98.20     Assessment & Plan:   Problem List Items Addressed This Visit     Health maintenance examination - Primary (Chronic)    Preventative protocols reviewed and updated unless pt declined. Discussed healthy diet and lifestyle.       Obesity, Class I, BMI 30-34.9    Encouraged healthy diet and lifestyle choices to affect sustainable weight loss.       Pure hypercholesterolemia    Chronic, stable on atorvastatin  The 10-year ASCVD risk score (Arnett DK, et al., 2019) is: 6.1%   Values used to calculate the score:     Age: 8 years     Sex: Male     Is Non-Hispanic African American: No     Diabetic: No     Tobacco smoker: No     Systolic Blood Pressure: A999333 mmHg     Is BP treated: No     HDL Cholesterol: 42.7 mg/dL     Total Cholesterol: 141 mg/dL       Relevant Medications   atorvastatin (LIPITOR) 20 MG tablet   GERD (gastroesophageal reflux disease)    Stable period on QOD PPI  Discussed PPI and  kidneys.  Discussed pepcid use.       Relevant Medications   omeprazole (PRILOSEC) 40 MG capsule   BPH (benign prostatic hyperplasia)    Followed by urology.       Renal insufficiency    Chronic, mild, stable.  Rec increased water intake, try to back off omeprazole as per above.       Osteoarthritis of right hip    Followed by ortho Rhona Raider), not ready for surgery.       Vitamin D deficiency    Has not been regular with vit D - rec start daily.       AK (actinic keratosis)    Anticipate AKs to temples. Refer to derm per pt request.  Discussed sunscreen use - discussed chemical vs physical sunscreens.       Relevant Orders   Ambulatory referral to Dermatology   Other Visit Diagnoses     Skin cancer screening       Relevant Orders   Ambulatory referral to Dermatology        Meds ordered this encounter  Medications   atorvastatin (LIPITOR) 20 MG tablet    Sig: Take 1 tablet (20 mg total) by mouth daily.    Dispense:  90 tablet    Refill:  4   omeprazole (PRILOSEC) 40 MG capsule    Sig: Take 1 capsule (40 mg total) by mouth  daily.    Dispense:  90 capsule    Refill:  4   Multiple Vitamin (MULTIVITAMIN ADULT) TABS    Sig: Take 1 tablet by mouth daily.    Orders Placed This Encounter  Procedures   Ambulatory referral to Dermatology    Referral Priority:   Routine    Referral Type:   Consultation    Referral Reason:   Specialty Services Required    Requested Specialty:   Dermatology    Number of Visits Requested:   1    Patient Instructions  We will refer you to skin doctor for skin cancer screening  Try to back off omeprazole use as able, cut down on caffeine, may try pepcid 20mg  over the counter  You are doing well today Return as needed or in 1 year for next physical.   Follow up plan: Return in about 1 year (around 11/16/2023), or if symptoms worsen or fail to improve, for annual exam, prior fasting for blood work.  Ria Bush, MD

## 2022-11-16 NOTE — Assessment & Plan Note (Signed)
Chronic, stable on atorvastatin  The 10-year ASCVD risk score (Arnett DK, et al., 2019) is: 6.1%   Values used to calculate the score:     Age: 62 years     Sex: Male     Is Non-Hispanic African American: No     Diabetic: No     Tobacco smoker: No     Systolic Blood Pressure: A999333 mmHg     Is BP treated: No     HDL Cholesterol: 42.7 mg/dL     Total Cholesterol: 141 mg/dL

## 2022-11-16 NOTE — Assessment & Plan Note (Signed)
Followed by ortho Rhona Raider), not ready for surgery.

## 2022-11-16 NOTE — Assessment & Plan Note (Addendum)
Anticipate AKs to temples. Refer to derm per pt request.  Discussed sunscreen use - discussed chemical vs physical sunscreens.

## 2022-12-07 IMAGING — MR MR PROSTATE WO/W CM
56 series · 56 of 56 positions shown · IV contrast (gadavist)
Comparison: None.

CLINICAL DATA: Increasing PSA. 59-year-old male. No biopsy history.
Most recent PSA equal 4.42 (11/04/2020)

EXAM:
MR PROSTATE WITHOUT AND WITH CONTRAST
TECHNIQUE: Multiplanar multisequence MRI images were obtained of the pelvis
centered about the prostate. Pre and post contrast images were
obtained.
CONTRAST:  10mL GADAVIST GADOBUTROL 1 MMOL/ML IV SOLN

[Series 4: T2 · axial · 3.0mm · 0.56mm/px · 1 of 25 slices shown (1 of 3)]
[im 1/25]
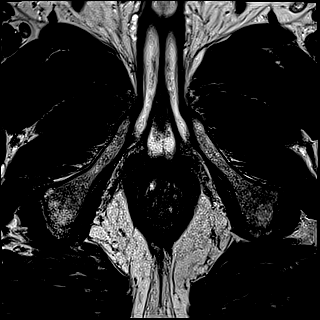

[Series 5: ax in&out whole · axial · 5.0mm · 0.74mm/px · 1 of 70 slices shown]
[im 1/70]
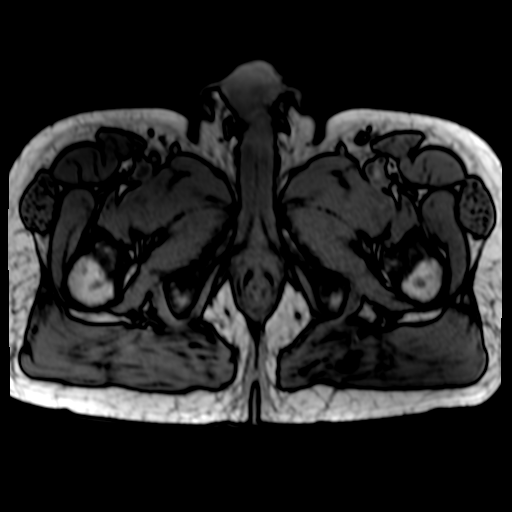

[Series 6: T2 · coronal · 3.0mm · 0.70mm/px · 1 of 35 slices shown (2 of 3)]
[im 1/35]
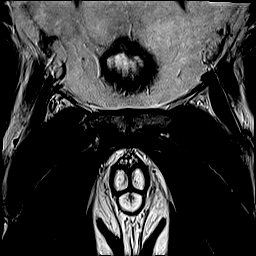

[Series 7: DWI · axial · 3.0mm · 0.86mm/px · 1 of 75 slices shown (1 of 3)]
[im 1/75]
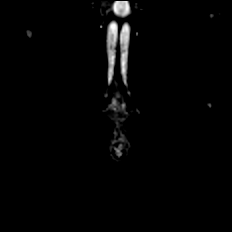

[Series 8: DWI · axial · 3.0mm · 0.86mm/px · 1 of 25 slices shown (2 of 3)]
[im 1/25]
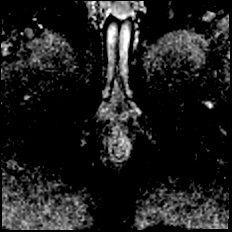

[Series 9: DWI · axial · 3.0mm · 0.86mm/px · 1 of 25 slices shown (3 of 3)]
[im 1/25]
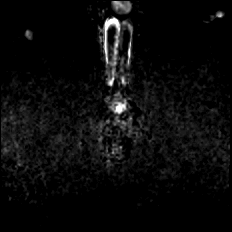

[Series 10: T2 · axial · 1.0mm · 1.04mm/px · 1 of 72 slices shown (3 of 3)]
[im 1/72]
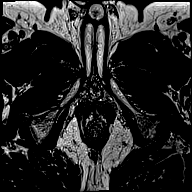

[Series 11: T1 · axial · 3.0mm · 1.15mm/px · 1 of 28 slices shown (1 of 49)]
[im 1/28]
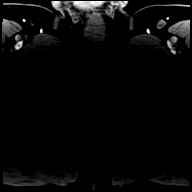

[Series 12: T1 · axial · 3.0mm · 1.15mm/px · 1 of 28 slices shown (2 of 49)]
[im 1/28]
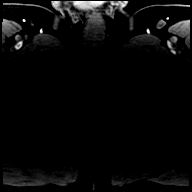

[Series 13: T1 · axial · 3.0mm · 1.15mm/px · 1 of 28 slices shown (3 of 49)]
[im 1/28]
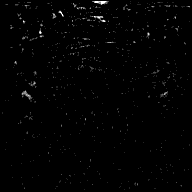

[Series 14: T1 · axial · 3.0mm · 1.15mm/px · 1 of 28 slices shown (4 of 49)]
[im 1/28]
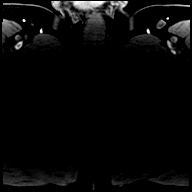

[Series 15: T1 · axial · 3.0mm · 1.15mm/px · 1 of 28 slices shown (5 of 49)]
[im 1/28]
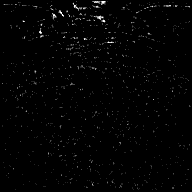

[Series 16: T1 · axial · 3.0mm · 1.15mm/px · 1 of 28 slices shown (6 of 49)]
[im 1/28]
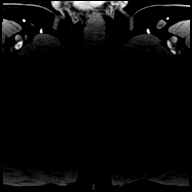

[Series 17: T1 · axial · 3.0mm · 1.15mm/px · 1 of 28 slices shown (7 of 49)]
[im 1/28]
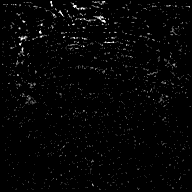

[Series 18: T1 · axial · 3.0mm · 1.15mm/px · 1 of 28 slices shown (8 of 49)]
[im 1/28]
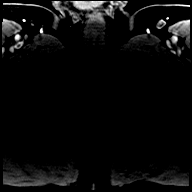

[Series 19: T1 · axial · 3.0mm · 1.15mm/px · 1 of 28 slices shown (9 of 49)]
[im 1/28]
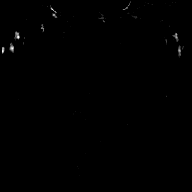

[Series 20: T1 · axial · 3.0mm · 1.15mm/px · 1 of 28 slices shown (10 of 49)]
[im 1/28]
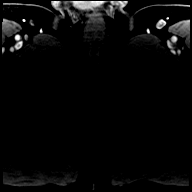

[Series 21: T1 · axial · 3.0mm · 1.15mm/px · 1 of 28 slices shown (11 of 49)]
[im 1/28]
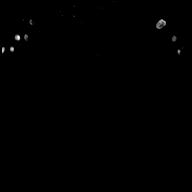

[Series 22: T1 · axial · 3.0mm · 1.15mm/px · 1 of 28 slices shown (12 of 49)]
[im 1/28]
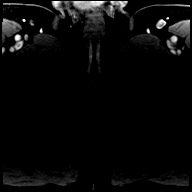

[Series 23: T1 · axial · 3.0mm · 1.15mm/px · 1 of 28 slices shown (13 of 49)]
[im 1/28]
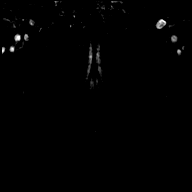

[Series 24: T1 · axial · 3.0mm · 1.15mm/px · 1 of 28 slices shown (14 of 49)]
[im 1/28]
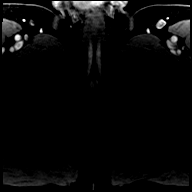

[Series 25: T1 · axial · 3.0mm · 1.15mm/px · 1 of 28 slices shown (15 of 49)]
[im 1/28]
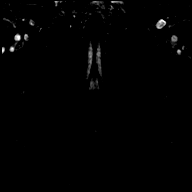

[Series 26: T1 · axial · 3.0mm · 1.15mm/px · 1 of 28 slices shown (16 of 49)]
[im 1/28]
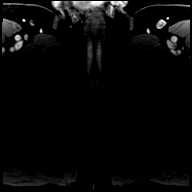

[Series 27: T1 · axial · 3.0mm · 1.15mm/px · 1 of 28 slices shown (17 of 49)]
[im 1/28]
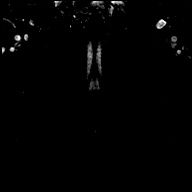

[Series 28: T1 · axial · 3.0mm · 1.15mm/px · 1 of 28 slices shown (18 of 49)]
[im 1/28]
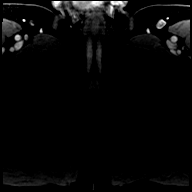

[Series 29: T1 · axial · 3.0mm · 1.15mm/px · 1 of 28 slices shown (19 of 49)]
[im 1/28]
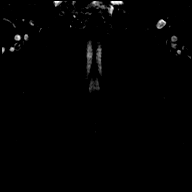

[Series 30: T1 · axial · 3.0mm · 1.15mm/px · 1 of 28 slices shown (20 of 49)]
[im 1/28]
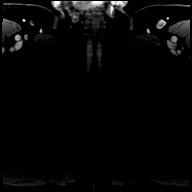

[Series 31: T1 · axial · 3.0mm · 1.15mm/px · 1 of 28 slices shown (21 of 49)]
[im 1/28]
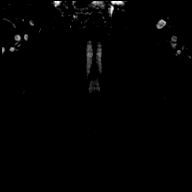

[Series 32: T1 · axial · 3.0mm · 1.15mm/px · 1 of 28 slices shown (22 of 49)]
[im 1/28]
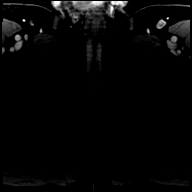

[Series 33: T1 · axial · 3.0mm · 1.15mm/px · 1 of 28 slices shown (23 of 49)]
[im 1/28]
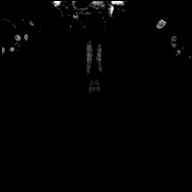

[Series 34: T1 · axial · 3.0mm · 1.15mm/px · 1 of 28 slices shown (24 of 49)]
[im 1/28]
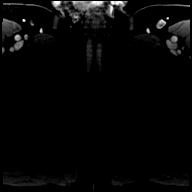

[Series 35: T1 · axial · 3.0mm · 1.15mm/px · 1 of 28 slices shown (25 of 49)]
[im 1/28]
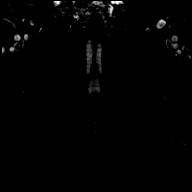

[Series 36: T1 · axial · 3.0mm · 1.15mm/px · 1 of 28 slices shown (26 of 49)]
[im 1/28]
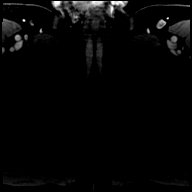

[Series 37: T1 · axial · 3.0mm · 1.15mm/px · 1 of 28 slices shown (27 of 49)]
[im 1/28]
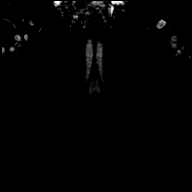

[Series 38: T1 · axial · 3.0mm · 1.15mm/px · 1 of 28 slices shown (28 of 49)]
[im 1/28]
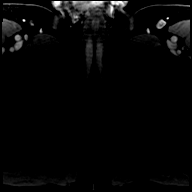

[Series 39: T1 · axial · 3.0mm · 1.15mm/px · 1 of 28 slices shown (29 of 49)]
[im 1/28]
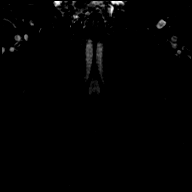

[Series 40: T1 · axial · 3.0mm · 1.15mm/px · 1 of 28 slices shown (30 of 49)]
[im 1/28]
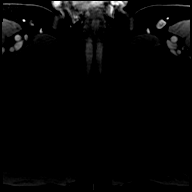

[Series 41: T1 · axial · 3.0mm · 1.15mm/px · 1 of 28 slices shown (31 of 49)]
[im 1/28]
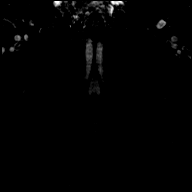

[Series 42: T1 · axial · 3.0mm · 1.15mm/px · 1 of 28 slices shown (32 of 49)]
[im 1/28]
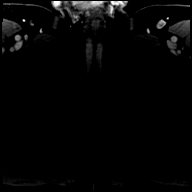

[Series 43: T1 · axial · 3.0mm · 1.15mm/px · 1 of 28 slices shown (33 of 49)]
[im 1/28]
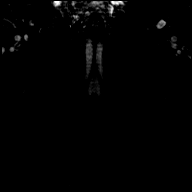

[Series 44: T1 · axial · 3.0mm · 1.15mm/px · 1 of 28 slices shown (34 of 49)]
[im 1/28]
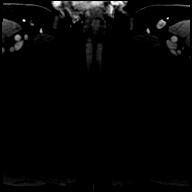

[Series 45: T1 · axial · 3.0mm · 1.15mm/px · 1 of 28 slices shown (35 of 49)]
[im 1/28]
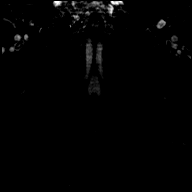

[Series 46: T1 · axial · 3.0mm · 1.15mm/px · 1 of 28 slices shown (36 of 49)]
[im 1/28]
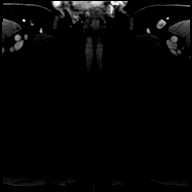

[Series 47: T1 · axial · 3.0mm · 1.15mm/px · 1 of 28 slices shown (37 of 49)]
[im 1/28]
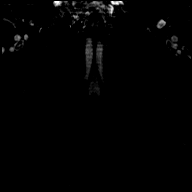

[Series 48: T1 · axial · 3.0mm · 1.15mm/px · 1 of 28 slices shown (38 of 49)]
[im 1/28]
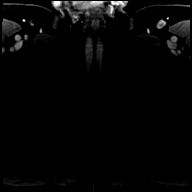

[Series 49: T1 · axial · 3.0mm · 1.15mm/px · 1 of 28 slices shown (39 of 49)]
[im 1/28]
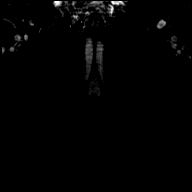

[Series 50: T1 · axial · 3.0mm · 1.15mm/px · 1 of 28 slices shown (40 of 49)]
[im 1/28]
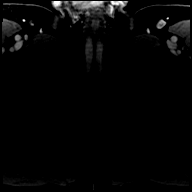

[Series 51: T1 · axial · 3.0mm · 1.15mm/px · 1 of 28 slices shown (41 of 49)]
[im 1/28]
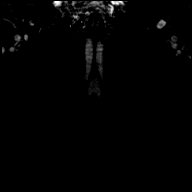

[Series 52: T1 · axial · 3.0mm · 1.15mm/px · 1 of 28 slices shown (42 of 49)]
[im 1/28]
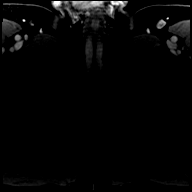

[Series 53: T1 · axial · 3.0mm · 1.15mm/px · 1 of 28 slices shown (43 of 49)]
[im 1/28]
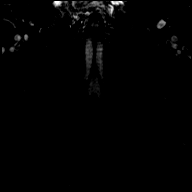

[Series 54: T1 · axial · 3.0mm · 1.15mm/px · 1 of 28 slices shown (44 of 49)]
[im 1/28]
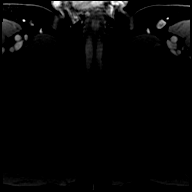

[Series 55: T1 · axial · 3.0mm · 1.15mm/px · 1 of 28 slices shown (45 of 49)]
[im 1/28]
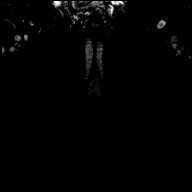

[Series 56: T1 · axial · 3.0mm · 1.15mm/px · 1 of 28 slices shown (46 of 49)]
[im 1/28]
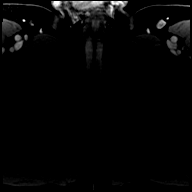

[Series 57: T1 · axial · 3.0mm · 1.15mm/px · 1 of 28 slices shown (47 of 49)]
[im 1/28]
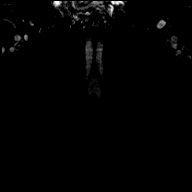

[Series 58: T1 · axial · 3.0mm · 1.15mm/px · 1 of 28 slices shown (48 of 49)]
[im 1/28]
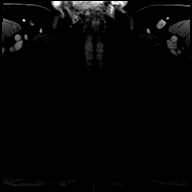

[Series 59: T1 · axial · 3.0mm · 1.15mm/px · 1 of 28 slices shown (49 of 49)]
[im 1/28]
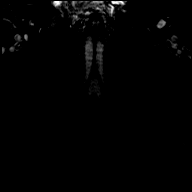

[56 of 56 positions shown; findings below may reference images not displayed]

FINDINGS: Prostate: No foci of restricted diffusion within the peripheral zone
(series 8). No focal lesion within the peripheral zone on T2
weighted imaging (series 4). Mild heterogeneous low signal within
LEFT and RIGHT mid gland is nonspecific may relate to prior
inflammation

The transitional zone is enlarged by well capsulated nodules. No
suspicious imaging findings on T2 weighted imaging.

No abnormal enhancement pattern on postcontrast imaging.

Volume: 4.9 x 4.4 x 4.0 (volume = 45)

Transcapsular spread:  Absent

Seminal vesicle involvement: Absent

Neurovascular bundle involvement: Absent

Pelvic adenopathy: Absent

Bone metastasis: Absent

Other findings: None
IMPRESSION: 1. No high-grade carcinoma identified in the peripheral zone. Mild
heterogeneity of signal intensity may relate to prior prosthetic
inflammation. PI-RADS: 2
2. Minimally enlarged nodular transitional zone most consistent
benign prostate hypertrophy. PI-RADS: 2
3. Prostatic capsule intact. Seminal vesicles normal. No
lymphadenopathy.

## 2023-08-28 ENCOUNTER — Other Ambulatory Visit: Payer: 59

## 2023-08-28 DIAGNOSIS — R972 Elevated prostate specific antigen [PSA]: Secondary | ICD-10-CM

## 2023-08-29 LAB — PSA: Prostate Specific Ag, Serum: 3.5 ng/mL (ref 0.0–4.0)

## 2023-08-31 ENCOUNTER — Ambulatory Visit: Payer: BC Managed Care – PPO | Admitting: Urology

## 2023-09-20 ENCOUNTER — Ambulatory Visit: Payer: BC Managed Care – PPO | Admitting: Urology

## 2023-10-11 ENCOUNTER — Ambulatory Visit: Payer: Self-pay | Admitting: Urology

## 2023-11-13 ENCOUNTER — Other Ambulatory Visit: Payer: Self-pay | Admitting: Family Medicine

## 2023-11-13 DIAGNOSIS — E7800 Pure hypercholesterolemia, unspecified: Secondary | ICD-10-CM

## 2023-11-13 DIAGNOSIS — N289 Disorder of kidney and ureter, unspecified: Secondary | ICD-10-CM

## 2023-11-13 DIAGNOSIS — N401 Enlarged prostate with lower urinary tract symptoms: Secondary | ICD-10-CM

## 2023-11-13 DIAGNOSIS — E559 Vitamin D deficiency, unspecified: Secondary | ICD-10-CM

## 2023-11-15 ENCOUNTER — Other Ambulatory Visit: Payer: BC Managed Care – PPO

## 2023-11-22 ENCOUNTER — Encounter: Payer: BC Managed Care – PPO | Admitting: Family Medicine

## 2024-01-27 ENCOUNTER — Other Ambulatory Visit: Payer: Self-pay | Admitting: Family Medicine

## 2024-01-27 DIAGNOSIS — E7800 Pure hypercholesterolemia, unspecified: Secondary | ICD-10-CM

## 2024-01-27 DIAGNOSIS — K219 Gastro-esophageal reflux disease without esophagitis: Secondary | ICD-10-CM

## 2024-01-29 ENCOUNTER — Telehealth: Payer: Self-pay

## 2024-01-29 DIAGNOSIS — R6882 Decreased libido: Secondary | ICD-10-CM

## 2024-01-29 NOTE — Telephone Encounter (Signed)
 Copied from CRM [id number]. Topic: Clinical - Request for Lab/Test Order >> Jan 29, 2024  9:34 AM Barry Wilcox wrote: Reason for CRM: Patient would like to know if he could have testosterone labs added to his CPE labs on July1st?

## 2024-01-29 NOTE — Telephone Encounter (Addendum)
 Is he having any fatigue, depressed mood or decreased sex drive? Need to have symptom or dx to link to testosterone check.

## 2024-01-30 ENCOUNTER — Other Ambulatory Visit

## 2024-01-30 NOTE — Telephone Encounter (Signed)
 Spoke with pt asking Dr Ocie Belt question. States he has decreased sex drive.

## 2024-01-30 NOTE — Addendum Note (Signed)
 Addended by: Claire Crick on: 01/30/2024 01:43 PM   Modules accepted: Orders

## 2024-01-30 NOTE — Telephone Encounter (Signed)
 I've added testosterone levels to upcoming lab visit.

## 2024-01-30 NOTE — Telephone Encounter (Signed)
Spoke with pt relaying Dr. G's message.  Pt expresses his thanks.  ?

## 2024-02-06 ENCOUNTER — Encounter: Admitting: Family Medicine

## 2024-02-13 ENCOUNTER — Other Ambulatory Visit (INDEPENDENT_AMBULATORY_CARE_PROVIDER_SITE_OTHER)

## 2024-02-13 DIAGNOSIS — N401 Enlarged prostate with lower urinary tract symptoms: Secondary | ICD-10-CM

## 2024-02-13 DIAGNOSIS — E559 Vitamin D deficiency, unspecified: Secondary | ICD-10-CM | POA: Diagnosis not present

## 2024-02-13 DIAGNOSIS — E7800 Pure hypercholesterolemia, unspecified: Secondary | ICD-10-CM

## 2024-02-13 DIAGNOSIS — N289 Disorder of kidney and ureter, unspecified: Secondary | ICD-10-CM

## 2024-02-13 DIAGNOSIS — R6882 Decreased libido: Secondary | ICD-10-CM

## 2024-02-13 LAB — COMPREHENSIVE METABOLIC PANEL WITH GFR
ALT: 13 U/L (ref 0–53)
AST: 17 U/L (ref 0–37)
Albumin: 4 g/dL (ref 3.5–5.2)
Alkaline Phosphatase: 72 U/L (ref 39–117)
BUN: 17 mg/dL (ref 6–23)
CO2: 28 meq/L (ref 19–32)
Calcium: 8.7 mg/dL (ref 8.4–10.5)
Chloride: 105 meq/L (ref 96–112)
Creatinine, Ser: 1.39 mg/dL (ref 0.40–1.50)
GFR: 55 mL/min — ABNORMAL LOW (ref >60.00)
Glucose, Bld: 112 mg/dL — ABNORMAL HIGH (ref 70–99)
Potassium: 4.2 meq/L (ref 3.5–5.1)
Sodium: 140 meq/L (ref 135–145)
Total Bilirubin: 0.6 mg/dL (ref 0.2–1.2)
Total Protein: 6.2 g/dL (ref 6.0–8.3)

## 2024-02-13 LAB — CBC WITH DIFFERENTIAL/PLATELET
Basophils Absolute: 0.1 10*3/uL (ref 0.0–0.1)
Basophils Relative: 1 % (ref 0.0–3.0)
Eosinophils Absolute: 0.2 10*3/uL (ref 0.0–0.7)
Eosinophils Relative: 2.6 % (ref 0.0–5.0)
HCT: 42.6 % (ref 39.0–52.0)
Hemoglobin: 14.6 g/dL (ref 13.0–17.0)
Lymphocytes Relative: 26.3 % (ref 12.0–46.0)
Lymphs Abs: 1.6 10*3/uL (ref 0.7–4.0)
MCHC: 34.2 g/dL (ref 30.0–36.0)
MCV: 83.2 fl (ref 78.0–100.0)
Monocytes Absolute: 0.4 10*3/uL (ref 0.1–1.0)
Monocytes Relative: 7.1 % (ref 3.0–12.0)
Neutro Abs: 3.8 10*3/uL (ref 1.4–7.7)
Neutrophils Relative %: 63 % (ref 43.0–77.0)
Platelets: 254 10*3/uL (ref 150.0–400.0)
RBC: 5.11 Mil/uL (ref 4.22–5.81)
RDW: 13.9 % (ref 11.5–15.5)
WBC: 6 10*3/uL (ref 4.0–10.5)

## 2024-02-13 LAB — MICROALBUMIN / CREATININE URINE RATIO
Creatinine,U: 173.6 mg/dL
Microalb Creat Ratio: 4.1 mg/g (ref 0.0–30.0)
Microalb, Ur: 0.7 mg/dL (ref 0.0–1.9)

## 2024-02-13 LAB — LIPID PANEL
Cholesterol: 149 mg/dL (ref 0–200)
HDL: 37.7 mg/dL — ABNORMAL LOW (ref >39.00)
LDL Cholesterol: 89 mg/dL (ref 0–99)
NonHDL: 111.06
Total CHOL/HDL Ratio: 4
Triglycerides: 108 mg/dL (ref 0.0–149.0)
VLDL: 21.6 mg/dL (ref 0.0–40.0)

## 2024-02-13 LAB — VITAMIN D 25 HYDROXY (VIT D DEFICIENCY, FRACTURES): VITD: 26.2 ng/mL — ABNORMAL LOW (ref 30.00–100.00)

## 2024-02-13 LAB — PSA: PSA: 4.64 ng/mL — ABNORMAL HIGH (ref 0.10–4.00)

## 2024-02-13 LAB — TESTOSTERONE: Testosterone: 299.41 ng/dL — ABNORMAL LOW (ref 300.00–890.00)

## 2024-02-17 ENCOUNTER — Ambulatory Visit: Payer: Self-pay | Admitting: Family Medicine

## 2024-02-20 ENCOUNTER — Ambulatory Visit (INDEPENDENT_AMBULATORY_CARE_PROVIDER_SITE_OTHER): Admitting: Family Medicine

## 2024-02-20 ENCOUNTER — Encounter: Payer: Self-pay | Admitting: Family Medicine

## 2024-02-20 VITALS — BP 118/78 | HR 69 | Temp 98.0°F | Ht 69.5 in | Wt 223.4 lb

## 2024-02-20 DIAGNOSIS — N289 Disorder of kidney and ureter, unspecified: Secondary | ICD-10-CM

## 2024-02-20 DIAGNOSIS — Z23 Encounter for immunization: Secondary | ICD-10-CM | POA: Diagnosis not present

## 2024-02-20 DIAGNOSIS — G4733 Obstructive sleep apnea (adult) (pediatric): Secondary | ICD-10-CM | POA: Insufficient documentation

## 2024-02-20 DIAGNOSIS — K219 Gastro-esophageal reflux disease without esophagitis: Secondary | ICD-10-CM | POA: Diagnosis not present

## 2024-02-20 DIAGNOSIS — E7800 Pure hypercholesterolemia, unspecified: Secondary | ICD-10-CM | POA: Diagnosis not present

## 2024-02-20 DIAGNOSIS — E66811 Obesity, class 1: Secondary | ICD-10-CM

## 2024-02-20 DIAGNOSIS — Z0000 Encounter for general adult medical examination without abnormal findings: Secondary | ICD-10-CM | POA: Diagnosis not present

## 2024-02-20 DIAGNOSIS — N401 Enlarged prostate with lower urinary tract symptoms: Secondary | ICD-10-CM

## 2024-02-20 DIAGNOSIS — R7989 Other specified abnormal findings of blood chemistry: Secondary | ICD-10-CM | POA: Insufficient documentation

## 2024-02-20 DIAGNOSIS — R0683 Snoring: Secondary | ICD-10-CM | POA: Diagnosis not present

## 2024-02-20 DIAGNOSIS — E559 Vitamin D deficiency, unspecified: Secondary | ICD-10-CM

## 2024-02-20 MED ORDER — OMEPRAZOLE 40 MG PO CPDR
40.0000 mg | DELAYED_RELEASE_CAPSULE | ORAL | 3 refills | Status: AC
Start: 2024-02-20 — End: ?

## 2024-02-20 MED ORDER — ATORVASTATIN CALCIUM 20 MG PO TABS: 20.0000 mg | ORAL_TABLET | Freq: Every day | ORAL | 3 refills | Status: AC

## 2024-02-20 MED ORDER — VITAMIN D3 50 MCG (2000 UT) PO CAPS: 2000.0000 [IU] | ORAL_CAPSULE | Freq: Every day | ORAL | Status: AC

## 2024-02-20 NOTE — Assessment & Plan Note (Signed)
 Levels low - increase vit D3 to 2000 units daily.

## 2024-02-20 NOTE — Progress Notes (Signed)
 Ph: [phone] Fax: [phone]   Patient ID: Barry Wilcox, male    DOB: [birth year], 63 y.o.   MRN: [id number]  This visit was conducted in person.  BP 118/78   Pulse 69   Temp 98 F (36.7 C) (Oral)   Ht 5' 9.5 (1.765 m)   Wt 223 lb 6 oz (101.3 kg)   SpO2 95%   BMI 32.51 kg/m    CC: CPE Subjective:   HPI: Barry Wilcox is a 63 y.o. male presenting on 02/20/2024 for Annual Exam   Sees urology [facility]) for elevated PSA, last seen 08/2022.    R hip arthritis saw ortho Dr Sheril s/p intraarticular steroid injection 2024 - less running for this reason. PRN ibuprofen. Involved in F3.   GERD - managed with PPI QOD.   Preventative: COLONOSCOPY 11/2021 - 4 TAs, rpt 5 yrs [facility]an) Prostate cancer screening - fmhx prostate cancer. Saw urology - prostate MRI 45gm, reassuring.  Lung cancer screening - not eligible  Flu shot tries yearly  COVID vaccine Pfizer 10/2019, 11/2019 Tdap 12/2013, 02/2019 Prevnar-20 today Shingrix  - 10/2020, 10/2021 Seat belt use discussed Sunscreen use discussed. Planning to see dermatologist.  Sleep - averaging 7-8 hours/night. - loud snoring, some witnessed apnea by wife. Overall restorative sleep, no daytime somnolence, no morning headaches.  ESS = 16 Non smoker  Alcohol - 1 drink per wk  Dentist - q6 months  Eye exam yearly   Lives with wife Donny and daughter, 3 dogs and 3 cats  Edu: PhD  Occ: Engineer, [employer] nurse  Activity: walking 1 hour daily, enjoys golfing  Diet: good water, fruits/vegetables daily      Relevant past medical, surgical, family and social history reviewed and updated as indicated. Interim medical history since our last visit reviewed. Allergies and medications reviewed and updated. Outpatient Medications Prior to Visit  Medication Sig Dispense Refill   albuterol  (VENTOLIN  HFA) 108 (90 Base) MCG/ACT inhaler Inhale into the lungs. As needed     Multiple Vitamin  (MULTIVITAMIN ADULT) TABS Take 1 tablet by mouth daily.     atorvastatin  (LIPITOR) 20 MG tablet TAKE 1 TABLET BY MOUTH EVERY DAY 90 tablet 0   Cholecalciferol (VITAMIN D3) 25 MCG (1000 UT) CAPS Take 1 capsule (1,000 Units total) by mouth daily. 30 capsule    omeprazole  (PRILOSEC) 40 MG capsule TAKE 1 CAPSULE (40 MG TOTAL) BY MOUTH DAILY. 90 capsule 0   tamsulosin  (FLOMAX ) 0.4 MG CAPS capsule TAKE 1 CAPSULE BY MOUTH EVERY DAY 90 capsule 1   No facility-administered medications prior to visit.     Per HPI unless specifically indicated in ROS section below Review of Systems  Constitutional:  Negative for activity change, appetite change, chills, fatigue, fever and unexpected weight change.  HENT:  Negative for hearing loss.   Eyes:  Negative for visual disturbance.  Respiratory:  Negative for cough, chest tightness, shortness of breath and wheezing.   Cardiovascular:  Negative for chest pain, palpitations and leg swelling.  Gastrointestinal:  Negative for abdominal distention, abdominal pain, blood in stool, constipation, diarrhea, nausea and vomiting.  Genitourinary:  Negative for difficulty urinating and hematuria.  Musculoskeletal:  Negative for arthralgias, myalgias and neck pain.  Skin:  Negative for rash.  Neurological:  Negative for dizziness, seizures, syncope and headaches.  Hematological:  Negative for adenopathy. Does not bruise/bleed easily.  Psychiatric/Behavioral:  Negative for dysphoric mood. The patient is not nervous/anxious.     Objective:  BP 118/78   Pulse 69   Temp 98 F (36.7 C) (Oral)   Ht 5' 9.5 (1.765 m)   Wt 223 lb 6 oz (101.3 kg)   SpO2 95%   BMI 32.51 kg/m   Wt Readings from Last 3 Encounters:  02/20/24 223 lb 6 oz (101.3 kg)  11/16/22 219 lb (99.3 kg)  08/26/22 223 lb (101.2 kg)      Physical Exam Vitals and nursing note reviewed.  Constitutional:      General: He is not in acute distress.    Appearance: Normal appearance. He is well-developed.  He is not ill-appearing.  HENT:     Head: Normocephalic and atraumatic.     Right Ear: Hearing, tympanic membrane, ear canal and external ear normal.     Left Ear: Hearing, tympanic membrane, ear canal and external ear normal.     Mouth/Throat:     Mouth: Mucous membranes are moist.     Pharynx: Oropharynx is clear. No oropharyngeal exudate or posterior oropharyngeal erythema.  Eyes:     General: No scleral icterus.    Extraocular Movements: Extraocular movements intact.     Conjunctiva/sclera: Conjunctivae normal.     Pupils: Pupils are equal, round, and reactive to light.  Neck:     Thyroid : No thyroid  mass or thyromegaly.     Vascular: No carotid bruit.     Comments: Neck circ 42.5cm Cardiovascular:     Rate and Rhythm: Normal rate and regular rhythm.     Pulses: Normal pulses.          Radial pulses are 2+ on the right side and 2+ on the left side.     Heart sounds: Normal heart sounds. No murmur heard. Pulmonary:     Effort: Pulmonary effort is normal. No respiratory distress.     Breath sounds: Normal breath sounds. No wheezing, rhonchi or rales.  Abdominal:     General: Bowel sounds are normal. There is no distension.     Palpations: Abdomen is soft. There is no mass.     Tenderness: There is no abdominal tenderness. There is no guarding or rebound.     Hernia: No hernia is present.  Musculoskeletal:        General: Normal range of motion.     Cervical back: Normal range of motion and neck supple.     Right lower leg: No edema.     Left lower leg: No edema.  Lymphadenopathy:     Cervical: No cervical adenopathy.  Skin:    General: Skin is warm and dry.     Findings: No rash.  Neurological:     General: No focal deficit present.     Mental Status: He is alert and oriented to person, place, and time.  Psychiatric:        Mood and Affect: Mood normal.        Behavior: Behavior normal.        Thought Content: Thought content normal.        Judgment: Judgment normal.        Results for orders placed or performed in visit on 02/13/24  Testosterone    Collection Time: 02/13/24  7:31 AM  Result Value Ref Range   Testosterone  299.41 (L) 300.00 - 890.00 ng/dL  Microalbumin / creatinine urine ratio   Collection Time: 02/13/24  7:31 AM  Result Value Ref Range   Microalb, Ur 0.7 0.0 - 1.9 mg/dL   Creatinine,U 826.3 mg/dL   Microalb Creat  Ratio 4.1 0.0 - 30.0 mg/g  VITAMIN D  25 Hydroxy (Vit-D Deficiency, Fractures)   Collection Time: 02/13/24  7:31 AM  Result Value Ref Range   VITD 26.20 (L) 30.00 - 100.00 ng/mL  CBC with Differential/Platelet   Collection Time: 02/13/24  7:31 AM  Result Value Ref Range   WBC 6.0 4.0 - 10.5 K/uL   RBC 5.11 4.22 - 5.81 Mil/uL   Hemoglobin 14.6 13.0 - 17.0 g/dL   HCT 57.3 60.9 - 47.9 %   MCV 83.2 78.0 - 100.0 fl   MCHC 34.2 30.0 - 36.0 g/dL   RDW 86.0 88.4 - 84.4 %   Platelets 254.0 150.0 - 400.0 K/uL   Neutrophils Relative % 63.0 43.0 - 77.0 %   Lymphocytes Relative 26.3 12.0 - 46.0 %   Monocytes Relative 7.1 3.0 - 12.0 %   Eosinophils Relative 2.6 0.0 - 5.0 %   Basophils Relative 1.0 0.0 - 3.0 %   Neutro Abs 3.8 1.4 - 7.7 K/uL   Lymphs Abs 1.6 0.7 - 4.0 K/uL   Monocytes Absolute 0.4 0.1 - 1.0 K/uL   Eosinophils Absolute 0.2 0.0 - 0.7 K/uL   Basophils Absolute 0.1 0.0 - 0.1 K/uL  Comprehensive metabolic panel with GFR   Collection Time: 02/13/24  7:31 AM  Result Value Ref Range   Sodium 140 135 - 145 mEq/L   Potassium 4.2 3.5 - 5.1 mEq/L   Chloride 105 96 - 112 mEq/L   CO2 28 19 - 32 mEq/L   Glucose, Bld 112 (H) 70 - 99 mg/dL   BUN 17 6 - 23 mg/dL   Creatinine, Ser 8.60 0.40 - 1.50 mg/dL   Total Bilirubin 0.6 0.2 - 1.2 mg/dL   Alkaline Phosphatase 72 39 - 117 U/L   AST 17 0 - 37 U/L   ALT 13 0 - 53 U/L   Total Protein 6.2 6.0 - 8.3 g/dL   Albumin 4.0 3.5 - 5.2 g/dL   GFR 44.99 (L) >39.99 mL/min   Calcium  8.7 8.4 - 10.5 mg/dL  Lipid panel   Collection Time: 02/13/24  7:31 AM  Result Value Ref Range    Cholesterol 149 0 - 200 mg/dL   Triglycerides 891.9 0.0 - 149.0 mg/dL   HDL 62.29 (L) >60.99 mg/dL   VLDL 78.3 0.0 - 59.9 mg/dL   LDL Cholesterol 89 0 - 99 mg/dL   Total CHOL/HDL Ratio 4    NonHDL 111.06   PSA   Collection Time: 02/13/24  7:31 AM  Result Value Ref Range   PSA 4.64 (H) 0.10 - 4.00 ng/mL    Assessment & Plan:   Problem List Items Addressed This Visit     Health maintenance examination - Primary (Chronic)   Preventative protocols reviewed and updated unless pt declined. Discussed healthy diet and lifestyle.       Obesity, Class I, BMI 30-34.9   Continue to encourage healthy diet and lifestyle choices to affect sustainable weight los.s       Pure hypercholesterolemia   Chronic, stable period on about 5d/wk atorvastatin  20mg .  Discussed cardiac CT with calcium  score  - as already on chol med will defer checking.  The 10-year ASCVD risk score (Arnett DK, et al., 2019) is: 7%   Values used to calculate the score:     Age: 83 years     Clincally relevant sex: Male     Is Non-Hispanic African American: No     Diabetic: No  Tobacco smoker: No     Systolic Blood Pressure: 118 mmHg     Is BP treated: No     HDL Cholesterol: 37.7 mg/dL     Total Cholesterol: 149 mg/dL       Relevant Medications   atorvastatin  (LIPITOR) 20 MG tablet   GERD (gastroesophageal reflux disease)   Stable period on every other day PPI       Relevant Medications   omeprazole  (PRILOSEC) 40 MG capsule   BPH (benign prostatic hyperplasia)   Followed by urology. PSA stable 3-4s over years. Prior prostate MRI reassuring       Renal insufficiency   Chronic, mild, GFR stable at 55 over years. Will continue to monitor. Discussed limiting NSAID use       Vitamin D  deficiency   Levels low - increase vit D3 to 2000 units daily.       Loud snoring   Loud snoring, witnessed apnea.  Positive screen (ESS 16) Order HST to eval for OSA       Low testosterone    Borderline low  levels - will check with uro at next visit.       Other Visit Diagnoses       Need for vaccination against Streptococcus pneumoniae       Relevant Orders   Pneumococcal conjugate vaccine 20-valent (Completed)        Meds ordered this encounter  Medications   atorvastatin  (LIPITOR) 20 MG tablet    Sig: Take 1 tablet (20 mg total) by mouth daily.    Dispense:  90 tablet    Refill:  3   omeprazole  (PRILOSEC) 40 MG capsule    Sig: Take 1 capsule (40 mg total) by mouth every other day.    Dispense:  45 capsule    Refill:  3   Cholecalciferol (VITAMIN D3) 50 MCG (2000 UT) capsule    Sig: Take 1 capsule (2,000 Units total) by mouth daily.    Orders Placed This Encounter  Procedures   Pneumococcal conjugate vaccine 20-valent    Patient Instructions  Prevnar-20 today  I will sign you up for home sleep test.  Good to see you today  Return as needed or in 1 year for next physical   Follow up plan: Return in about 1 year (around 02/19/2025) for annual exam, prior fasting for blood work.  Anton Blas, MD

## 2024-02-20 NOTE — Assessment & Plan Note (Signed)
 Chronic, stable period on about 5d/wk atorvastatin  20mg .  Discussed cardiac CT with calcium  score  - as already on chol med will defer checking.  The 10-year ASCVD risk score (Arnett DK, et al., 2019) is: 7%   Values used to calculate the score:     Age: 63 years     Clincally relevant sex: Male     Is Non-Hispanic African American: No     Diabetic: No     Tobacco smoker: No     Systolic Blood Pressure: 118 mmHg     Is BP treated: No     HDL Cholesterol: 37.7 mg/dL     Total Cholesterol: 149 mg/dL

## 2024-02-20 NOTE — Assessment & Plan Note (Signed)
 Stable period on every other day PPI

## 2024-02-20 NOTE — Assessment & Plan Note (Addendum)
 Followed by urology. PSA stable 3-4s over years. Prior prostate MRI reassuring

## 2024-02-20 NOTE — Assessment & Plan Note (Signed)
 Preventative protocols reviewed and updated unless pt declined. Discussed healthy diet and lifestyle.

## 2024-02-20 NOTE — Assessment & Plan Note (Signed)
 Borderline low levels - will check with uro at next visit.

## 2024-02-20 NOTE — Assessment & Plan Note (Addendum)
 Chronic, mild, GFR stable at 55 over years. Will continue to monitor. Discussed limiting NSAID use

## 2024-02-20 NOTE — Assessment & Plan Note (Signed)
 Continue to encourage healthy diet and lifestyle choices to affect sustainable weight loss.

## 2024-02-20 NOTE — Patient Instructions (Addendum)
 Prevnar-20 today  I will sign you up for home sleep test.  Good to see you today  Return as needed or in 1 year for next physical

## 2024-02-20 NOTE — Assessment & Plan Note (Addendum)
 Loud snoring, witnessed apnea.  Positive screen (ESS 16) Order HST to eval for OSA

## 2024-03-29 ENCOUNTER — Telehealth: Payer: Self-pay | Admitting: Family Medicine

## 2024-03-29 DIAGNOSIS — G4733 Obstructive sleep apnea (adult) (pediatric): Secondary | ICD-10-CM

## 2024-03-29 NOTE — Telephone Encounter (Signed)
 Called patient no answer no voice mail.

## 2024-03-29 NOTE — Telephone Encounter (Signed)
 Please notify recent home sleep test returned showing signs of moderate sleep apnea - would recommend proceeding with CPAP trial. Does he have a DME supplier preference? If not I will send request to [facility].   HST 02/2024: AHI 23.1, RDI 27.1, few central sleep apneas, hypoxia to 80% with desaturation 4% of time.

## 2024-04-03 ENCOUNTER — Encounter: Payer: Self-pay | Admitting: Family Medicine

## 2024-04-03 NOTE — Telephone Encounter (Signed)
 Pt returned call and stated he has lost 20 pounds and intends on losing 30 more pounds within next 2 months. He would like to know if Dr. KANDICE still recommends CPAP or can he wait until he loses the weight to do another study to see if he still needs it. Please call pt to advise

## 2024-04-03 NOTE — Telephone Encounter (Signed)
 Lvm asking pt to call back. Plz relay Dr Talmadge message and get answer to his question.

## 2024-04-04 NOTE — Telephone Encounter (Signed)
 Verified with pt does want to start c-pap. Orders started on [facility]. Printed order and placed in box for providers signature.

## 2024-04-04 NOTE — Telephone Encounter (Unsigned)
 Copied from CRM #[id number]. Topic: Clinical - Medical Advice >> Apr 04, 2024  8:30 AM Donna BRAVO wrote: Reason for CRM: patient calling in asking for Dr Rilla to write a script for a C-Pap. Patient would like to have the order placed for the C-pap

## 2024-04-05 NOTE — Telephone Encounter (Signed)
 Spoke with patient. Forms filled out and in CMA box.

## 2024-04-05 NOTE — Telephone Encounter (Signed)
Faxed forms to [facility].

## 2024-04-05 NOTE — Telephone Encounter (Unsigned)
 Copied from CRM [id number]. Topic: Clinical - Order For Equipment >> Apr 05, 2024  9:32 AM Anairis L wrote: Reason for CRM: [facility] [phone] Returing Amy Brown CPAP.  She is requesting a call back.Thank you.

## 2024-08-02 ENCOUNTER — Other Ambulatory Visit: Payer: Self-pay

## 2024-08-02 DIAGNOSIS — R972 Elevated prostate specific antigen [PSA]: Secondary | ICD-10-CM

## 2024-08-12 ENCOUNTER — Other Ambulatory Visit: Payer: Self-pay

## 2024-08-12 DIAGNOSIS — R972 Elevated prostate specific antigen [PSA]: Secondary | ICD-10-CM

## 2024-08-13 LAB — PSA: Prostate Specific Ag, Serum: 2.7 ng/mL (ref 0.0–4.0)

## 2024-08-19 ENCOUNTER — Encounter: Payer: Self-pay | Admitting: Urology

## 2024-08-19 ENCOUNTER — Ambulatory Visit: Payer: Self-pay | Admitting: Urology

## 2024-08-19 VITALS — BP 121/83 | HR 70 | Ht 69.5 in | Wt 198.2 lb

## 2024-08-19 DIAGNOSIS — Z87898 Personal history of other specified conditions: Secondary | ICD-10-CM | POA: Diagnosis not present

## 2024-08-19 NOTE — Progress Notes (Signed)
 "  08/19/2024 8:28 AM   [name] 10-Jun-1961 [id number]  Referring provider: Rilla Baller, MD [address] [place],  [place] [address]  Chief Complaint  Patient presents with   Elevated PSA   Urologic history: 1.  Elevated PSA Initial visit 11/2020 PSA 4.42 Prostate MRI, 45 g gland without suspicious lesions PSAD: 0.098  HPI: Barry Wilcox is a 64 y.o. male who presents for a follow-up visit.  Last seen 08/26/2022 Has been doing well since his last visit.  Since last seen PSA has been stable with the exception of a PSA bump 02/13/2024 to 4.64.  It was repeated 08/12/2024 and back to baseline at 2.7   PSA trend    Prostate Specific Ag, Serum  Latest Ref Rng 0.0 - 4.0 ng/mL  12/08/2017 3.73  11/04/2020 4.42  12/22/2020 4.0  08/05/2021 3.3  08/24/2022 5.3  10/07/2022 3.3  08/28/2023 3.5  02/13/2024 4.64  08/12/2024 2.7      PMH: Past Medical History:  Diagnosis Date   Allergy    BCC (basal cell carcinoma of skin) 06/2018   L jaw, presumed s/p biopsy and removal (Dr Dais [facility])   Cellulitis 12/2013   LUE cat bite   GERD (gastroesophageal reflux disease)    PAST,TAKE MEDICATION   History of chicken pox    History of kidney stones    x2   Persistent cough 09/05/2020    Surgical History: Past Surgical History:  Procedure Laterality Date   COLONOSCOPY  2012   [facility])   COLONOSCOPY  11/2021   4 TAs, rpt 5 yrs [facility])   SHOULDER ARTHROSCOPY W/ ROTATOR CUFF REPAIR Left 2007    Home Medications:  Allergies as of 08/19/2024   No Known Allergies      Medication List        Accurate as of August 19, 2024  8:28 AM. If you have any questions, ask your nurse or doctor.          albuterol  108 (90 Base) MCG/ACT inhaler Commonly known as: VENTOLIN  HFA Inhale into the lungs. As needed   atorvastatin  20 MG tablet Commonly known as: LIPITOR Take 1 tablet (20 mg total) by mouth daily.   Multivitamin Adult Tabs Take 1  tablet by mouth daily.   omeprazole  40 MG capsule Commonly known as: PRILOSEC Take 1 capsule (40 mg total) by mouth every other day.   Vitamin D3 50 MCG (2000 UT) capsule Take 1 capsule (2,000 Units total) by mouth daily.        Allergies: Allergies[1]  Family History: Family History  Problem Relation Age of Onset   Diabetes Mother    CAD Mother        stent   Diverticulitis Mother    Cancer Father 41       prostate   CAD Brother 76       stent   Stroke Neg Hx    Colon cancer Neg Hx    Colon polyps Neg Hx    Crohn's disease Neg Hx    Esophageal cancer Neg Hx    Stomach cancer Neg Hx    Rectal cancer Neg Hx     Social History:  reports that he has never smoked. He has never been exposed to tobacco smoke. He has never used smokeless tobacco. He reports current alcohol use. He reports that he does not use drugs.   Physical Exam: BP 121/83   Pulse 70   Ht 5' 9.5 (1.765  m)   Wt 198 lb 3.2 oz (89.9 kg)   BMI 28.85 kg/m   Constitutional:  Alert, No acute distress. HEENT: [place] AT Respiratory: Normal respiratory effort, no increased work of breathing. Psychiatric: Normal mood and affect.   Assessment & Plan:    1.  History elevated PSA Intermittent PSA elevations most likely secondary to inflammation.  Prior prostate MRI showed no suspicious lesions He would like to continue annual urology follow-up and appointment was made    [name], MD  [facility] [address], [address] [place], [place] [address] [phone]     [1] No Known Allergies  "

## 2025-02-18 ENCOUNTER — Other Ambulatory Visit

## 2025-02-25 ENCOUNTER — Encounter: Admitting: Family Medicine

## 2025-08-19 ENCOUNTER — Ambulatory Visit: Admitting: Urology
